# Patient Record
Sex: Female | Born: 1975
Health system: Southern US, Community
[De-identification: ages and names within clinical notes are randomized; demographics above are authoritative.]

## PROBLEM LIST (undated history)

## (undated) DIAGNOSIS — R002 Palpitations: Secondary | ICD-10-CM

## (undated) DIAGNOSIS — R87629 Unspecified abnormal cytological findings in specimens from vagina: Secondary | ICD-10-CM

## (undated) DIAGNOSIS — R87619 Unspecified abnormal cytological findings in specimens from cervix uteri: Secondary | ICD-10-CM

## (undated) DIAGNOSIS — D649 Anemia, unspecified: Secondary | ICD-10-CM

## (undated) DIAGNOSIS — IMO0002 Reserved for concepts with insufficient information to code with codable children: Secondary | ICD-10-CM

## (undated) DIAGNOSIS — O09529 Supervision of elderly multigravida, unspecified trimester: Secondary | ICD-10-CM

## (undated) HISTORY — DX: Anemia, unspecified: D64.9

## (undated) HISTORY — PX: OTHER SURGICAL HISTORY: SHX169

## (undated) HISTORY — DX: Unspecified abnormal cytological findings in specimens from cervix uteri: R87.619

## (undated) HISTORY — DX: Reserved for concepts with insufficient information to code with codable children: IMO0002

## (undated) HISTORY — PX: WISDOM TOOTH EXTRACTION: SHX21

## (undated) HISTORY — PX: LEEP: SHX91

## (undated) HISTORY — DX: Palpitations: R00.2

---

## 2005-10-19 ENCOUNTER — Emergency Department (HOSPITAL_COMMUNITY): Admission: EM | Admit: 2005-10-19 | Discharge: 2005-10-19 | Payer: Self-pay | Admitting: Emergency Medicine

## 2005-12-24 ENCOUNTER — Emergency Department (HOSPITAL_COMMUNITY): Admission: EM | Admit: 2005-12-24 | Discharge: 2005-12-24 | Payer: Self-pay | Admitting: Emergency Medicine

## 2006-04-07 ENCOUNTER — Emergency Department (HOSPITAL_COMMUNITY): Admission: EM | Admit: 2006-04-07 | Discharge: 2006-04-07 | Payer: Self-pay | Admitting: Family Medicine

## 2006-09-23 ENCOUNTER — Other Ambulatory Visit: Admission: RE | Admit: 2006-09-23 | Discharge: 2006-09-23 | Payer: Self-pay | Admitting: Gynecology

## 2006-12-20 ENCOUNTER — Emergency Department (HOSPITAL_COMMUNITY): Admission: EM | Admit: 2006-12-20 | Discharge: 2006-12-20 | Payer: Self-pay | Admitting: Emergency Medicine

## 2009-04-11 ENCOUNTER — Other Ambulatory Visit: Admission: RE | Admit: 2009-04-11 | Discharge: 2009-04-11 | Payer: Self-pay | Admitting: Gynecology

## 2009-04-11 ENCOUNTER — Ambulatory Visit: Payer: Self-pay | Admitting: Gynecology

## 2010-10-28 LAB — RPR: RPR: NONREACTIVE

## 2010-10-28 LAB — HEPATITIS B SURFACE ANTIGEN: Hepatitis B Surface Ag: NEGATIVE

## 2010-10-28 LAB — ABO/RH

## 2011-04-19 LAB — STREP B DNA PROBE: GBS: POSITIVE

## 2011-05-03 ENCOUNTER — Inpatient Hospital Stay (HOSPITAL_COMMUNITY): Admission: AD | Admit: 2011-05-03 | Payer: Self-pay | Source: Ambulatory Visit | Admitting: Obstetrics and Gynecology

## 2011-05-13 ENCOUNTER — Encounter (HOSPITAL_COMMUNITY): Payer: Self-pay | Admitting: *Deleted

## 2011-05-13 ENCOUNTER — Telehealth (HOSPITAL_COMMUNITY): Payer: Self-pay | Admitting: *Deleted

## 2011-05-13 NOTE — Telephone Encounter (Signed)
Preadmission screen  

## 2011-05-14 ENCOUNTER — Telehealth (HOSPITAL_COMMUNITY): Payer: Self-pay | Admitting: *Deleted

## 2011-05-14 ENCOUNTER — Encounter (HOSPITAL_COMMUNITY): Payer: Self-pay | Admitting: *Deleted

## 2011-05-14 NOTE — Telephone Encounter (Signed)
Preadmission screen  

## 2011-05-20 ENCOUNTER — Telehealth (HOSPITAL_COMMUNITY): Payer: Self-pay | Admitting: *Deleted

## 2011-05-20 NOTE — Telephone Encounter (Signed)
Preadmission screen  

## 2011-05-24 ENCOUNTER — Inpatient Hospital Stay (HOSPITAL_COMMUNITY): Admission: RE | Admit: 2011-05-24 | Payer: Self-pay | Source: Ambulatory Visit

## 2011-05-29 ENCOUNTER — Encounter (HOSPITAL_COMMUNITY): Payer: Self-pay | Admitting: Pharmacist

## 2011-05-30 ENCOUNTER — Inpatient Hospital Stay (HOSPITAL_COMMUNITY)
Admission: RE | Admit: 2011-05-30 | Discharge: 2011-06-03 | DRG: 765 | Disposition: A | Discharge status: Home / Self Care | Payer: 59 | Source: Ambulatory Visit | Attending: Obstetrics and Gynecology | Admitting: Obstetrics and Gynecology

## 2011-05-30 ENCOUNTER — Encounter (HOSPITAL_COMMUNITY): Payer: Self-pay

## 2011-05-30 DIAGNOSIS — Z22330 Carrier of Group B streptococcus: Secondary | ICD-10-CM

## 2011-05-30 DIAGNOSIS — O99892 Other specified diseases and conditions complicating childbirth: Secondary | ICD-10-CM | POA: Diagnosis present

## 2011-05-30 DIAGNOSIS — O09529 Supervision of elderly multigravida, unspecified trimester: Secondary | ICD-10-CM | POA: Diagnosis present

## 2011-05-30 DIAGNOSIS — O411090 Infection of amniotic sac and membranes, unspecified, unspecified trimester, not applicable or unspecified: Secondary | ICD-10-CM | POA: Diagnosis present

## 2011-05-30 DIAGNOSIS — Z3480 Encounter for supervision of other normal pregnancy, unspecified trimester: Secondary | ICD-10-CM | POA: Insufficient documentation

## 2011-05-30 LAB — CBC
HCT: 35 % — ABNORMAL LOW (ref 36.0–46.0)
MCV: 92.6 fL (ref 78.0–100.0)
Platelets: 201 10*3/uL (ref 150–400)
RBC: 3.78 MIL/uL — ABNORMAL LOW (ref 3.87–5.11)
WBC: 10.3 10*3/uL (ref 4.0–10.5)

## 2011-05-30 MED ORDER — DEXTROSE 5 % IV SOLN
2.5000 10*6.[IU] | INTRAVENOUS | Status: DC
  Administered 2011-05-31 (×6): 2.5 10*6.[IU] via INTRAVENOUS
  Filled 2011-05-30 (×5): qty 2.5

## 2011-05-30 MED ORDER — OXYTOCIN 20 UNITS IN LACTATED RINGERS INFUSION - SIMPLE: 125.0000 mL/h | Freq: Once | INTRAVENOUS | Status: DC

## 2011-05-30 MED ORDER — PENICILLIN G POTASSIUM 5000000 UNITS IJ SOLR
5.0000 10*6.[IU] | Freq: Once | INTRAVENOUS | Status: AC
  Administered 2011-05-30: 5 10*6.[IU] via INTRAVENOUS
  Filled 2011-05-30: qty 5

## 2011-05-30 MED ORDER — ONDANSETRON HCL 4 MG/2ML IJ SOLN: 4.0000 mg | Freq: Four times a day (QID) | INTRAMUSCULAR | Status: DC | PRN

## 2011-05-30 MED ORDER — LACTATED RINGERS IV SOLN
INTRAVENOUS | Status: DC
  Administered 2011-05-30 – 2011-05-31 (×3): via INTRAVENOUS
  Administered 2011-06-01: 125 mL/h via INTRAVENOUS

## 2011-05-30 MED ORDER — PENICILLIN G POTASSIUM 5000000 UNITS IJ SOLR: 2.5000 10*6.[IU] | Freq: Four times a day (QID) | INTRAVENOUS | Status: DC

## 2011-05-30 MED ORDER — OXYTOCIN BOLUS FROM INFUSION
500.0000 mL | Freq: Once | INTRAVENOUS | Status: DC
  Filled 2011-05-30: qty 500

## 2011-05-30 MED ORDER — LIDOCAINE HCL (PF) 1 % IJ SOLN
30.0000 mL | INTRAMUSCULAR | Status: DC | PRN
  Filled 2011-05-30: qty 30

## 2011-05-30 MED ORDER — MISOPROSTOL 25 MCG QUARTER TABLET
25.0000 ug | ORAL_TABLET | ORAL | Status: DC | PRN
  Administered 2011-05-30: 25 ug via VAGINAL
  Filled 2011-05-30 (×2): qty 0.25

## 2011-05-30 MED ORDER — ACETAMINOPHEN 325 MG PO TABS
650.0000 mg | ORAL_TABLET | ORAL | Status: DC | PRN
  Administered 2011-05-31: 650 mg via ORAL
  Filled 2011-05-30: qty 2

## 2011-05-30 MED ORDER — IBUPROFEN 600 MG PO TABS: 600.0000 mg | ORAL_TABLET | Freq: Four times a day (QID) | ORAL | Status: DC | PRN

## 2011-05-30 MED ORDER — FLEET ENEMA 7-19 GM/118ML RE ENEM
1.0000 | ENEMA | Freq: Once | RECTAL | Status: AC
  Administered 2011-05-30: 1 via RECTAL

## 2011-05-30 MED ORDER — CITRIC ACID-SODIUM CITRATE 334-500 MG/5ML PO SOLN
30.0000 mL | ORAL | Status: DC | PRN
  Administered 2011-05-30 – 2011-06-01 (×2): 30 mL via ORAL
  Filled 2011-05-30 (×2): qty 15

## 2011-05-30 MED ORDER — LACTATED RINGERS IV SOLN
500.0000 mL | INTRAVENOUS | Status: DC | PRN
  Administered 2011-05-31 (×3): 500 mL via INTRAVENOUS

## 2011-05-30 MED ORDER — TERBUTALINE SULFATE 1 MG/ML IJ SOLN: 0.2500 mg | Freq: Once | INTRAMUSCULAR | Status: AC | PRN

## 2011-05-30 MED ORDER — OXYCODONE-ACETAMINOPHEN 5-325 MG PO TABS: 1.0000 | ORAL_TABLET | ORAL | Status: DC | PRN

## 2011-05-30 MED ORDER — PENICILLIN G POTASSIUM 5000000 UNITS IJ SOLR: 5.0000 10*6.[IU] | Freq: Once | INTRAVENOUS | Status: DC

## 2011-05-30 NOTE — H&P (Signed)
Laura Carroll is a 36 y.o. female presenting for induction of labor.  Prenatal care essentially uncomplicated, see prenatal records for complete history.  Maternal Medical History:  Prenatal complications: no prenatal complications   OB History    Grav Para Term Preterm Abortions TAB SAB Ect Mult Living   3 2 2       2     SVD at term x 2 without complications  Past Medical History  Diagnosis Date  . Abnormal Pap smear    Past Surgical History  Procedure Date  . Wisdom tooth extraction   . Leep   . Dental implant    Family History: family history includes Alcohol abuse in her mother; Anxiety disorder in her mother; COPD in her maternal grandmother; Cancer in her maternal aunt and maternal grandfather; and Mental illness in her mother. Social History:  reports that she has never smoked. She has never used smokeless tobacco. She reports that she does not drink alcohol or use illicit drugs.  Review of Systems  Respiratory: Negative.   Cardiovascular: Negative.       Last menstrual period 08/14/2010. Maternal Exam:  Uterine Assessment: Contraction frequency is rare.   Abdomen: Patient reports no abdominal tenderness. Estimated fetal weight is 8 lbs.   Fetal presentation: vertex  Introitus: Normal vulva. Normal vagina.    Physical Exam  Constitutional: She appears well-developed and well-nourished.  Cardiovascular: Normal rate, regular rhythm and normal heart sounds.   No murmur heard. Respiratory: Effort normal and breath sounds normal. No respiratory distress. She has no wheezes.  GI: Soft.       Gravid     Prenatal labs: ABO, Rh: O/Positive/-- (09/26 0000) Antibody: Negative (09/26 0000) Rubella: Immune (09/26 0000) RPR: Nonreactive (09/26 0000)  HBsAg: Negative (09/26 0000)  HIV: Non-reactive (09/26 0000)  GBS: Positive (03/18 0000)   Assessment/Plan: IUP at 41+ weeks with unfavorable cervix, being admitted for ripening and induction.  Will start Cytotec  and evaluate in am for further plan.  Will start PCN for GBS prophylaxis.     Drury Ardizzone D 05/30/2011, 6:04 PM

## 2011-05-31 ENCOUNTER — Encounter (HOSPITAL_COMMUNITY): Payer: Self-pay

## 2011-05-31 ENCOUNTER — Encounter (HOSPITAL_COMMUNITY): Payer: Self-pay | Admitting: Anesthesiology

## 2011-05-31 MED ORDER — FENTANYL 2.5 MCG/ML BUPIVACAINE 1/10 % EPIDURAL INFUSION (WH - ANES)
INTRAMUSCULAR | Status: DC | PRN
  Administered 2011-05-31: 14 mL/h via EPIDURAL

## 2011-05-31 MED ORDER — FENTANYL 2.5 MCG/ML BUPIVACAINE 1/10 % EPIDURAL INFUSION (WH - ANES)
14.0000 mL/h | INTRAMUSCULAR | Status: DC
  Administered 2011-05-31 – 2011-06-01 (×2): 14 mL/h via EPIDURAL
  Filled 2011-05-31 (×3): qty 60

## 2011-05-31 MED ORDER — PHENYLEPHRINE 40 MCG/ML (10ML) SYRINGE FOR IV PUSH (FOR BLOOD PRESSURE SUPPORT): 80.0000 ug | PREFILLED_SYRINGE | INTRAVENOUS | Status: DC | PRN

## 2011-05-31 MED ORDER — TERBUTALINE SULFATE 1 MG/ML IJ SOLN: 0.2500 mg | Freq: Once | INTRAMUSCULAR | Status: AC | PRN

## 2011-05-31 MED ORDER — OXYTOCIN 20 UNITS IN LACTATED RINGERS INFUSION - SIMPLE
1.0000 m[IU]/min | INTRAVENOUS | Status: DC
  Administered 2011-05-31: 4 m[IU]/min via INTRAVENOUS
  Administered 2011-05-31: 1 m[IU]/min via INTRAVENOUS
  Filled 2011-05-31: qty 1000

## 2011-05-31 MED ORDER — ZOLPIDEM TARTRATE 10 MG PO TABS
10.0000 mg | ORAL_TABLET | Freq: Every evening | ORAL | Status: DC | PRN
  Administered 2011-05-31: 10 mg via ORAL
  Filled 2011-05-31: qty 1

## 2011-05-31 MED ORDER — EPHEDRINE 5 MG/ML INJ
10.0000 mg | INTRAVENOUS | Status: DC | PRN
  Filled 2011-05-31: qty 4

## 2011-05-31 MED ORDER — PHENYLEPHRINE 40 MCG/ML (10ML) SYRINGE FOR IV PUSH (FOR BLOOD PRESSURE SUPPORT)
80.0000 ug | PREFILLED_SYRINGE | INTRAVENOUS | Status: DC | PRN
  Filled 2011-05-31: qty 5

## 2011-05-31 MED ORDER — SODIUM CHLORIDE 0.9 % IV SOLN
3.0000 g | Freq: Four times a day (QID) | INTRAVENOUS | Status: DC
  Administered 2011-05-31: 3 g via INTRAVENOUS
  Filled 2011-05-31 (×4): qty 3

## 2011-05-31 MED ORDER — DIPHENHYDRAMINE HCL 50 MG/ML IJ SOLN: 12.5000 mg | INTRAMUSCULAR | Status: DC | PRN

## 2011-05-31 MED ORDER — EPHEDRINE 5 MG/ML INJ: 10.0000 mg | INTRAVENOUS | Status: DC | PRN

## 2011-05-31 MED ORDER — LACTATED RINGERS IV SOLN
500.0000 mL | Freq: Once | INTRAVENOUS | Status: AC
  Administered 2011-05-31: 17:00:00 via INTRAVENOUS

## 2011-05-31 MED ORDER — LIDOCAINE HCL (PF) 1 % IJ SOLN
INTRAMUSCULAR | Status: DC | PRN
  Administered 2011-05-31 (×2): 4 mL

## 2011-05-31 NOTE — Anesthesia Preprocedure Evaluation (Signed)
Anesthesia Evaluation  Patient identified by MRN, date of birth, ID band Patient awake    Reviewed: Allergy & Precautions, H&P , Patient's Chart, lab work & pertinent test results  Airway Mallampati: II TM Distance: >3 FB Neck ROM: full    Dental No notable dental hx. (+) Teeth Intact   Pulmonary neg pulmonary ROS,  breath sounds clear to auscultation  Pulmonary exam normal       Cardiovascular negative cardio ROS  Rhythm:regular Rate:Normal     Neuro/Psych negative neurological ROS  negative psych ROS   GI/Hepatic Neg liver ROS, GERD-  Medicated and Controlled,  Endo/Other  negative endocrine ROS  Renal/GU negative Renal ROS  negative genitourinary   Musculoskeletal   Abdominal   Peds  Hematology negative hematology ROS (+)   Anesthesia Other Findings   Reproductive/Obstetrics (+) Pregnancy                           Anesthesia Physical Anesthesia Plan  ASA: II  Anesthesia Plan: Epidural   Post-op Pain Management:    Induction:   Airway Management Planned:   Additional Equipment:   Intra-op Plan:   Post-operative Plan:   Informed Consent: I have reviewed the patients History and Physical, chart, labs and discussed the procedure including the risks, benefits and alternatives for the proposed anesthesia with the patient or authorized representative who has indicated his/her understanding and acceptance.     Plan Discussed with: Anesthesiologist, Surgeon and CRNA  Anesthesia Plan Comments:         Anesthesia Quick Evaluation

## 2011-05-31 NOTE — Progress Notes (Signed)
Pt able to sleep through UC's and contracting greater than 3 in ten min window.  Dr Ambrose Mantle on unit and notified.  No orders.

## 2011-05-31 NOTE — Progress Notes (Signed)
Comfortable, feeling some pressure Temp 101, VSS FHT- Cat II, some variables, mod variability, + scalp stim, ctx q 3 minutes VE- 8/80/0, vtx Starting Unasyn for probable chorioamnionitis, d/c PCN.  Continue pitocin, monitor progress.

## 2011-05-31 NOTE — Progress Notes (Signed)
Feeling ctx, only got one dose of cytotec due to ctx Afeb, VSS FHT- Cat I, irreg ctx VE- 1/30/-3, vtx Will place foley in cervix and start pitocin

## 2011-05-31 NOTE — Progress Notes (Signed)
Dr. Jackelyn Knife on the phone and notified of FHR change, SVE, RN interventions, maternal temperature and pitocin level. MD stated he will review the strip from home. Will continue to monitor.

## 2011-05-31 NOTE — Progress Notes (Signed)
Dr. Jackelyn Knife at the bedside and notified of pt status and SVE. Provider reviewed strip. Will continue to monitor.

## 2011-05-31 NOTE — Progress Notes (Signed)
Feeling ctx Afeb, VSS FHT- Cat I, irreg ctx VE- foley removed, was just outside cervix, 3/50/-3, vtx Continue pitocin, monitor progress

## 2011-05-31 NOTE — Progress Notes (Signed)
Comfortable with epidural Afeb, VSS FHT- Cat I, ctx q 3 min VE- 5/50/-2, vtx, AROM clear Comtinue pitocin, monitor progress

## 2011-05-31 NOTE — Progress Notes (Signed)
Dr. Jackelyn Knife on the phone and reviewed the strip. Orders received to start antibiotic. Will continue to monitor.

## 2011-05-31 NOTE — Anesthesia Procedure Notes (Signed)
Epidural Patient location during procedure: OB Start time: 05/31/2011 5:39 PM  Staffing Anesthesiologist: Vaneza Pickart A.  Preanesthetic Checklist Completed: patient identified, site marked, surgical consent, pre-op evaluation, timeout performed, IV checked, risks and benefits discussed and monitors and equipment checked  Epidural Patient position: sitting Prep: site prepped and draped and DuraPrep Patient monitoring: continuous pulse ox and blood pressure Approach: midline Injection technique: LOR air  Needle:  Needle type: Tuohy  Needle gauge: 17 G Needle length: 9 cm Needle insertion depth: 5 cm cm Catheter type: closed end flexible Catheter size: 19 Gauge Catheter at skin depth: 10 cm Test dose: negative and Other  Assessment Events: blood not aspirated, injection not painful, no injection resistance, negative IV test and no paresthesia  Additional Notes Patient identified. Risks and benefits discussed including failed block, incomplete  Pain control, post dural puncture headache, nerve damage, paralysis, blood pressure Changes, nausea, vomiting, reactions to medications-both toxic and allergic and post Partum back pain. All questions were answered. Patient expressed understanding and wished to proceed. Sterile technique was used throughout procedure. Epidural site was Dressed with sterile barrier dressing. No paresthesias, signs of intravascular injection Or signs of intrathecal spread were encountered.  Patient was more comfortable after the epidural was dosed. Please see RN's note for documentation of vital signs and FHR which are stable.

## 2011-05-31 NOTE — Progress Notes (Signed)
Vtx by palpation prior to starting pitocin

## 2011-05-31 NOTE — Progress Notes (Signed)
Dr. Jackelyn Knife on the phone and notified of pt status, FHR, and SVE. Will continue to monitor.

## 2011-05-31 NOTE — Progress Notes (Signed)
Foley placed in cervix, 60 cc saline in balloon

## 2011-06-01 ENCOUNTER — Encounter (HOSPITAL_COMMUNITY): Payer: Self-pay | Admitting: Anesthesiology

## 2011-06-01 ENCOUNTER — Encounter (HOSPITAL_COMMUNITY): Payer: Self-pay

## 2011-06-01 ENCOUNTER — Encounter (HOSPITAL_COMMUNITY)
Admission: RE | Disposition: A | Discharge status: Home / Self Care | Payer: Self-pay | Source: Ambulatory Visit | Attending: Obstetrics and Gynecology

## 2011-06-01 ENCOUNTER — Inpatient Hospital Stay (HOSPITAL_COMMUNITY): Payer: 59 | Admitting: Anesthesiology

## 2011-06-01 SURGERY — Surgical Case
Anesthesia: Regional

## 2011-06-01 MED ORDER — NALOXONE HCL 0.4 MG/ML IJ SOLN: 0.4000 mg | INTRAMUSCULAR | Status: DC | PRN

## 2011-06-01 MED ORDER — KETOROLAC TROMETHAMINE 30 MG/ML IJ SOLN
30.0000 mg | Freq: Four times a day (QID) | INTRAMUSCULAR | Status: AC | PRN
  Administered 2011-06-01: 30 mg via INTRAVENOUS

## 2011-06-01 MED ORDER — MORPHINE SULFATE 0.5 MG/ML IJ SOLN
INTRAMUSCULAR | Status: AC
  Filled 2011-06-01: qty 10

## 2011-06-01 MED ORDER — EPHEDRINE SULFATE 50 MG/ML IJ SOLN
INTRAMUSCULAR | Status: DC | PRN
  Administered 2011-06-01: 15 mg via INTRAVENOUS

## 2011-06-01 MED ORDER — SODIUM CHLORIDE 0.9 % IV SOLN
3.0000 g | Freq: Four times a day (QID) | INTRAVENOUS | Status: DC
  Filled 2011-06-01 (×8): qty 3

## 2011-06-01 MED ORDER — SODIUM CHLORIDE 0.9 % IV SOLN
3.0000 g | Freq: Once | INTRAVENOUS | Status: AC
  Administered 2011-06-01: 3 g via INTRAVENOUS
  Filled 2011-06-01: qty 3

## 2011-06-01 MED ORDER — SODIUM CHLORIDE 0.9 % IV SOLN
1.0000 ug/kg/h | INTRAVENOUS | Status: DC | PRN
  Filled 2011-06-01: qty 2.5

## 2011-06-01 MED ORDER — NALBUPHINE SYRINGE 5 MG/0.5 ML
5.0000 mg | INJECTION | INTRAMUSCULAR | Status: DC | PRN
  Filled 2011-06-01: qty 1

## 2011-06-01 MED ORDER — SODIUM CHLORIDE 0.9 % IJ SOLN: 3.0000 mL | INTRAMUSCULAR | Status: DC | PRN

## 2011-06-01 MED ORDER — OXYTOCIN 10 UNIT/ML IJ SOLN
INTRAMUSCULAR | Status: AC
  Filled 2011-06-01: qty 4

## 2011-06-01 MED ORDER — DIPHENHYDRAMINE HCL 25 MG PO CAPS: 25.0000 mg | ORAL_CAPSULE | ORAL | Status: DC | PRN

## 2011-06-01 MED ORDER — PRENATAL MULTIVITAMIN CH
1.0000 | ORAL_TABLET | Freq: Every day | ORAL | Status: DC
  Administered 2011-06-02 – 2011-06-03 (×2): 1 via ORAL
  Filled 2011-06-01 (×2): qty 1

## 2011-06-01 MED ORDER — SODIUM BICARBONATE 8.4 % IV SOLN
INTRAVENOUS | Status: AC
  Filled 2011-06-01: qty 50

## 2011-06-01 MED ORDER — LANOLIN HYDROUS EX OINT: 1.0000 "application " | TOPICAL_OINTMENT | CUTANEOUS | Status: DC | PRN

## 2011-06-01 MED ORDER — FENTANYL CITRATE 0.05 MG/ML IJ SOLN: 25.0000 ug | INTRAMUSCULAR | Status: DC | PRN

## 2011-06-01 MED ORDER — LACTATED RINGERS IV SOLN
INTRAVENOUS | Status: DC | PRN
  Administered 2011-06-01 (×3): via INTRAVENOUS

## 2011-06-01 MED ORDER — SIMETHICONE 80 MG PO CHEW: 80.0000 mg | CHEWABLE_TABLET | ORAL | Status: DC | PRN

## 2011-06-01 MED ORDER — PROMETHAZINE HCL 25 MG/ML IJ SOLN: 6.2500 mg | INTRAMUSCULAR | Status: DC | PRN

## 2011-06-01 MED ORDER — ONDANSETRON HCL 4 MG/2ML IJ SOLN
INTRAMUSCULAR | Status: DC | PRN
  Administered 2011-06-01: 4 mg via INTRAVENOUS

## 2011-06-01 MED ORDER — LIDOCAINE-EPINEPHRINE (PF) 2 %-1:200000 IJ SOLN
INTRAMUSCULAR | Status: AC
  Filled 2011-06-01: qty 20

## 2011-06-01 MED ORDER — MEPERIDINE HCL 25 MG/ML IJ SOLN: 6.2500 mg | INTRAMUSCULAR | Status: DC | PRN

## 2011-06-01 MED ORDER — TETANUS-DIPHTH-ACELL PERTUSSIS 5-2.5-18.5 LF-MCG/0.5 IM SUSP: 0.5000 mL | Freq: Once | INTRAMUSCULAR | Status: DC

## 2011-06-01 MED ORDER — PHENYLEPHRINE HCL 10 MG/ML IJ SOLN
INTRAMUSCULAR | Status: DC | PRN
  Administered 2011-06-01: 80 ug via INTRAVENOUS

## 2011-06-01 MED ORDER — DIPHENHYDRAMINE HCL 25 MG PO CAPS: 25.0000 mg | ORAL_CAPSULE | Freq: Four times a day (QID) | ORAL | Status: DC | PRN

## 2011-06-01 MED ORDER — KETOROLAC TROMETHAMINE 30 MG/ML IJ SOLN: 30.0000 mg | Freq: Four times a day (QID) | INTRAMUSCULAR | Status: AC | PRN

## 2011-06-01 MED ORDER — PHENYLEPHRINE 40 MCG/ML (10ML) SYRINGE FOR IV PUSH (FOR BLOOD PRESSURE SUPPORT)
PREFILLED_SYRINGE | INTRAVENOUS | Status: AC
  Filled 2011-06-01: qty 5

## 2011-06-01 MED ORDER — MENTHOL 3 MG MT LOZG: 1.0000 | LOZENGE | OROMUCOSAL | Status: DC | PRN

## 2011-06-01 MED ORDER — ONDANSETRON HCL 4 MG PO TABS: 4.0000 mg | ORAL_TABLET | ORAL | Status: DC | PRN

## 2011-06-01 MED ORDER — ACETAMINOPHEN 325 MG PO TABS: 325.0000 mg | ORAL_TABLET | ORAL | Status: DC | PRN

## 2011-06-01 MED ORDER — SODIUM CHLORIDE 0.9 % IV SOLN
3.0000 g | Freq: Four times a day (QID) | INTRAVENOUS | Status: DC
  Administered 2011-06-01 – 2011-06-02 (×2): 3 g via INTRAVENOUS
  Filled 2011-06-01 (×5): qty 3

## 2011-06-01 MED ORDER — EPHEDRINE 5 MG/ML INJ
INTRAVENOUS | Status: AC
  Filled 2011-06-01: qty 10

## 2011-06-01 MED ORDER — MORPHINE SULFATE (PF) 0.5 MG/ML IJ SOLN
INTRAMUSCULAR | Status: DC | PRN
  Administered 2011-06-01: 3 mg via EPIDURAL

## 2011-06-01 MED ORDER — WITCH HAZEL-GLYCERIN EX PADS: 1.0000 "application " | MEDICATED_PAD | CUTANEOUS | Status: DC | PRN

## 2011-06-01 MED ORDER — OXYTOCIN 20 UNITS IN LACTATED RINGERS INFUSION - SIMPLE
125.0000 mL/h | INTRAVENOUS | Status: AC
  Administered 2011-06-01: 125 mL/h via INTRAVENOUS
  Filled 2011-06-01: qty 1000

## 2011-06-01 MED ORDER — MORPHINE SULFATE (PF) 0.5 MG/ML IJ SOLN
INTRAMUSCULAR | Status: DC | PRN
  Administered 2011-06-01: 2 mg via EPIDURAL

## 2011-06-01 MED ORDER — DIPHENHYDRAMINE HCL 50 MG/ML IJ SOLN: 25.0000 mg | INTRAMUSCULAR | Status: DC | PRN

## 2011-06-01 MED ORDER — MAGNESIUM HYDROXIDE 400 MG/5ML PO SUSP: 30.0000 mL | ORAL | Status: DC | PRN

## 2011-06-01 MED ORDER — SENNOSIDES-DOCUSATE SODIUM 8.6-50 MG PO TABS
2.0000 | ORAL_TABLET | Freq: Every day | ORAL | Status: DC
  Administered 2011-06-02: 2 via ORAL

## 2011-06-01 MED ORDER — OXYCODONE-ACETAMINOPHEN 5-325 MG PO TABS
1.0000 | ORAL_TABLET | ORAL | Status: DC | PRN
  Administered 2011-06-01 – 2011-06-03 (×5): 1 via ORAL
  Filled 2011-06-01 (×5): qty 1

## 2011-06-01 MED ORDER — ONDANSETRON HCL 4 MG/2ML IJ SOLN: 4.0000 mg | INTRAMUSCULAR | Status: DC | PRN

## 2011-06-01 MED ORDER — SCOPOLAMINE 1 MG/3DAYS TD PT72
MEDICATED_PATCH | TRANSDERMAL | Status: AC
  Filled 2011-06-01: qty 1

## 2011-06-01 MED ORDER — ONDANSETRON HCL 4 MG/2ML IJ SOLN
INTRAMUSCULAR | Status: AC
  Filled 2011-06-01: qty 2

## 2011-06-01 MED ORDER — MIDAZOLAM HCL 2 MG/2ML IJ SOLN: 0.5000 mg | Freq: Once | INTRAMUSCULAR | Status: DC | PRN

## 2011-06-01 MED ORDER — ONDANSETRON HCL 4 MG/2ML IJ SOLN: 4.0000 mg | Freq: Three times a day (TID) | INTRAMUSCULAR | Status: DC | PRN

## 2011-06-01 MED ORDER — ZOLPIDEM TARTRATE 5 MG PO TABS: 5.0000 mg | ORAL_TABLET | Freq: Every evening | ORAL | Status: DC | PRN

## 2011-06-01 MED ORDER — ACETAMINOPHEN 10 MG/ML IV SOLN
1000.0000 mg | Freq: Four times a day (QID) | INTRAVENOUS | Status: AC | PRN
  Filled 2011-06-01: qty 100

## 2011-06-01 MED ORDER — IBUPROFEN 600 MG PO TABS
600.0000 mg | ORAL_TABLET | Freq: Four times a day (QID) | ORAL | Status: DC
  Administered 2011-06-01 – 2011-06-03 (×9): 600 mg via ORAL
  Filled 2011-06-01 (×9): qty 1

## 2011-06-01 MED ORDER — DIBUCAINE 1 % RE OINT: 1.0000 "application " | TOPICAL_OINTMENT | RECTAL | Status: DC | PRN

## 2011-06-01 MED ORDER — SODIUM BICARBONATE 8.4 % IV SOLN
INTRAVENOUS | Status: DC | PRN
  Administered 2011-06-01: 10 mL via EPIDURAL

## 2011-06-01 MED ORDER — METOCLOPRAMIDE HCL 5 MG/ML IJ SOLN: 10.0000 mg | Freq: Three times a day (TID) | INTRAMUSCULAR | Status: DC | PRN

## 2011-06-01 MED ORDER — SCOPOLAMINE 1 MG/3DAYS TD PT72
1.0000 | MEDICATED_PATCH | Freq: Once | TRANSDERMAL | Status: DC
  Administered 2011-06-01: 1.5 mg via TRANSDERMAL

## 2011-06-01 MED ORDER — DIPHENHYDRAMINE HCL 50 MG/ML IJ SOLN: 12.5000 mg | INTRAMUSCULAR | Status: DC | PRN

## 2011-06-01 MED ORDER — OXYTOCIN 20 UNITS IN LACTATED RINGERS INFUSION - SIMPLE
INTRAVENOUS | Status: DC | PRN
  Administered 2011-06-01: 20 [IU] via INTRAVENOUS

## 2011-06-01 MED ORDER — SIMETHICONE 80 MG PO CHEW
80.0000 mg | CHEWABLE_TABLET | Freq: Three times a day (TID) | ORAL | Status: DC
  Administered 2011-06-01 – 2011-06-03 (×8): 80 mg via ORAL

## 2011-06-01 MED ORDER — KETOROLAC TROMETHAMINE 30 MG/ML IJ SOLN
INTRAMUSCULAR | Status: AC
  Filled 2011-06-01: qty 1

## 2011-06-01 MED ORDER — MEASLES, MUMPS & RUBELLA VAC ~~LOC~~ INJ
0.5000 mL | INJECTION | Freq: Once | SUBCUTANEOUS | Status: DC
  Filled 2011-06-01: qty 0.5

## 2011-06-01 SURGICAL SUPPLY — 26 items
CHLORAPREP W/TINT 26ML (MISCELLANEOUS) ×2 IMPLANT
CLOTH BEACON ORANGE TIMEOUT ST (SAFETY) ×2 IMPLANT
CONTAINER PREFILL 10% NBF 15ML (MISCELLANEOUS) IMPLANT
ELECT REM PT RETURN 9FT ADLT (ELECTROSURGICAL) ×2
ELECTRODE REM PT RTRN 9FT ADLT (ELECTROSURGICAL) ×1 IMPLANT
EXTRACTOR VACUUM KIWI (MISCELLANEOUS) IMPLANT
EXTRACTOR VACUUM M CUP 4 TUBE (SUCTIONS) IMPLANT
GLOVE BIO SURGEON STRL SZ8 (GLOVE) ×2 IMPLANT
GLOVE ORTHO TXT STRL SZ7.5 (GLOVE) ×2 IMPLANT
GOWN PREVENTION PLUS LG XLONG (DISPOSABLE) ×4 IMPLANT
KIT ABG SYR 3ML LUER SLIP (SYRINGE) IMPLANT
NEEDLE HYPO 25X5/8 SAFETYGLIDE (NEEDLE) ×2 IMPLANT
NS IRRIG 1000ML POUR BTL (IV SOLUTION) ×2 IMPLANT
PACK C SECTION WH (CUSTOM PROCEDURE TRAY) ×2 IMPLANT
RTRCTR C-SECT PINK 25CM LRG (MISCELLANEOUS) ×2 IMPLANT
SLEEVE SCD COMPRESS KNEE MED (MISCELLANEOUS) IMPLANT
STAPLER VISISTAT 35W (STAPLE) IMPLANT
SUT CHROMIC 1 CTX 36 (SUTURE) ×4 IMPLANT
SUT PLAIN 0 NONE (SUTURE) IMPLANT
SUT PLAIN 2 0 XLH (SUTURE) IMPLANT
SUT VIC AB 0 CT1 27 (SUTURE) ×2
SUT VIC AB 0 CT1 27XBRD ANBCTR (SUTURE) ×2 IMPLANT
SUT VIC AB 4-0 KS 27 (SUTURE) IMPLANT
TOWEL OR 17X24 6PK STRL BLUE (TOWEL DISPOSABLE) ×4 IMPLANT
TRAY FOLEY CATH 14FR (SET/KITS/TRAYS/PACK) ×2 IMPLANT
WATER STERILE IRR 1000ML POUR (IV SOLUTION) ×2 IMPLANT

## 2011-06-01 NOTE — Progress Notes (Signed)
Cervix unchanged FHT improved Will proceed with c-section for arrest of dilation

## 2011-06-01 NOTE — Progress Notes (Signed)
Comfortable Afeb now, VSS FHT- Cat II, some prolonged decels, mod variability, + scalp stim, ctx q 3 min VE- unchanged Discussed protracted labor with pt and husband, given option of watching for one more hour as long as FHT ok, or proceeding with c-section now.  They want to wait one more hour.  Discussed c-section procedure and risks.  Will recheck at 0230, c-section if no change.  Continue Unasyn for chorio.

## 2011-06-01 NOTE — Progress Notes (Signed)
POD #0 LTCS Doing ok so far Afeb, VSS Abd- soft, sl distended, dressing C/D/I Continue routine care, continue Unasyn for chorio.  Discussed circumcision procedure and risks.

## 2011-06-01 NOTE — Progress Notes (Signed)
Dr. Jackelyn Knife notified of pt status, FHR, decelerations, RN interventions, UC pattern, pitocin level and SVE. MD states he will come and reassess in an hour. Will continue to monitor.

## 2011-06-01 NOTE — Progress Notes (Signed)
Dr. Jackelyn Knife at the bedside and discussed the risks and benefits of a caesarean section delivery. Pt stated understanding and informed consents signed. Will continue to monitor.

## 2011-06-01 NOTE — Progress Notes (Signed)
Dr. Jackelyn Knife at the bedside. Decision made for caesarean section delivery due to arrest of dilatation.

## 2011-06-01 NOTE — Anesthesia Postprocedure Evaluation (Signed)
Anesthesia Post Note  Patient: Laura Carroll  Procedure(s) Performed: Procedure(s) (LRB): CESAREAN SECTION (N/A)  Anesthesia type: Epidural  Patient location: PACU  Post pain: Pain level controlled  Post assessment: Post-op Vital signs reviewed  Last Vitals:  Filed Vitals:   06/01/11 0500  BP:   Pulse: 107  Temp:   Resp: 24    Post vital signs: Reviewed  Level of consciousness: awake  Complications: No apparent anesthesia complications

## 2011-06-01 NOTE — Op Note (Signed)
Preoperative diagnosis: Intrauterine pregnancy at 41 weeks, arrest of dilation Postoperative diagnosis: Same Procedure: Primary low transverse cesarean section without extensions Surgeon: Lavina Hamman M.D. Anesthesia: Epidural Findings: Patient had normal gravid anatomy and delivered a viable female infant with Apgars of 9 and 9 weight pending Estimated blood loss: 800 cc Specimens: Placenta sent to labor and delivery Complications: None  Procedure in detail: The patient was taken to the operating room and placed in the dorsosupine position. Her previously placed epidural was dosed appropriately. Abdomen was then prepped and draped in the usual sterile fashion. A Foley catheter had previously been placed. The level of her anesthesia was found to be adequate. Abdomen was entered via a standard Pfannenstiel incision. Once the peritoneal cavity was entered the Alexis disposable self-retaining retractor was placed and good visualization was achieved. A 4 cm transverse incision was then made in the lower uterine segment pushing the bladder inferior. Once the uterine cavity was entered the incision was extended digitally. The fetal vertex was grasped and delivered through the incision atraumatically. Mouth and nares were suctioned. The remainder of the infant then delivered atraumatically. Cord was doubly clamped and cut and the infant handed to the awaiting pediatric team. Cord blood was obtained. The placenta delivered spontaneously. Uterus was wiped dry with clean lap pad and all clots and debris were removed. Uterine incision was inspected and found to be free of extensions. Uterine incision was closed in 2 layers with the first layer being a running locking layer with #1 chromic and the second layer being an imbricating layer also with #1 chromic. Bleeding from the right angle was controlled with 2 figure 8 sutures of #1 Chromic.  Tubes and ovaries were inspected and found to be normal. Uterine incision  was inspected and found to be hemostatic. Bleeding from serosal edges was controlled with electrocautery. The Alexis retractor was removed. Subfascial space was irrigated and made hemostatic with electrocautery. Fascia was closed in running fashion starting at both ends and meeting in the middle with 0 Vicryl. Subcutaneous tissue was then irrigated and made hemostatic with electrocautery and then closed with running 2-0 plain gut.   Skin was closed with staples followed by a sterile dressing. Patient tolerated the procedure well and was taken to the recovery in stable condition. Counts were correct x2 and she had PAS hose on throughout the procedure.

## 2011-06-01 NOTE — Transfer of Care (Signed)
Immediate Anesthesia Transfer of Care Note  Patient: Laura Carroll  Procedure(s) Performed: Procedure(s) (LRB): CESAREAN SECTION (N/A)  Patient Location: PACU  Anesthesia Type: Epidural  Level of Consciousness: awake  Airway & Oxygen Therapy: Patient Spontanous Breathing  Post-op Assessment: Report given to PACU RN and Post -op Vital signs reviewed and stable  Post vital signs: stable  Complications: No apparent anesthesia complications

## 2011-06-02 ENCOUNTER — Encounter (HOSPITAL_COMMUNITY): Payer: Self-pay | Admitting: Obstetrics and Gynecology

## 2011-06-02 LAB — CBC
MCH: 30.7 pg (ref 26.0–34.0)
MCHC: 32.5 g/dL (ref 30.0–36.0)
MCV: 94.5 fL (ref 78.0–100.0)
Platelets: 171 10*3/uL (ref 150–400)
RBC: 2.9 MIL/uL — ABNORMAL LOW (ref 3.87–5.11)
RDW: 14.8 % (ref 11.5–15.5)

## 2011-06-02 NOTE — Progress Notes (Signed)
Subjective: Postpartum Day #1: Cesarean Delivery Patient reports incisional pain and tolerating PO.    Objective: Vital signs in last 24 hours: Temp:  [98.3 F (36.8 C)-98.5 F (36.9 C)] 98.3 F (36.8 C) (05/01 0533) Pulse Rate:  [75-92] 75  (05/01 0533) Resp:  [18] 18  (05/01 0533) BP: (93-115)/(53-71) 93/57 mmHg (05/01 0533) SpO2:  [95 %-99 %] 98 % (04/30 2300)  Physical Exam:  General: alert Lochia: appropriate Uterine Fundus: firm Incision: dressing C/D/I   Basename 06/02/11 0525 05/30/11 2047  HGB 8.9* 11.9*  HCT 27.4* 35.0*    Assessment/Plan: Status post Cesarean section. Doing well postoperatively.  Continue current care, will recheck Hgb tomorrow am to make sure it is stable.  Since afebrile will d/c Unasyn.  Merlina Marchena D 06/02/2011, 8:45 AM

## 2011-06-03 LAB — CBC
HCT: 24.8 % — ABNORMAL LOW (ref 36.0–46.0)
MCHC: 32.3 g/dL (ref 30.0–36.0)
MCV: 95 fL (ref 78.0–100.0)
RDW: 14.7 % (ref 11.5–15.5)
WBC: 9.2 10*3/uL (ref 4.0–10.5)

## 2011-06-03 MED ORDER — OXYCODONE-ACETAMINOPHEN 5-325 MG PO TABS: 1.0000 | ORAL_TABLET | ORAL | Status: AC | PRN

## 2011-06-03 NOTE — Progress Notes (Signed)
POD #2 LTCS Doing ok, sore Afeb, VSS Abd- soft, fundus firm, incision intact Hgb 8.9 to 8.0 Will continue current care

## 2011-06-03 NOTE — Discharge Instructions (Signed)
As per discharge pamphlet °

## 2011-06-03 NOTE — Discharge Summary (Addendum)
Obstetric Discharge Summary Reason for Admission: induction of labor Prenatal Procedures: NST Intrapartum Procedures: cesarean: low cervical, transverse Postpartum Procedures: none Complications-Operative and Postpartum: none Hemoglobin  Date Value Range Status  06/03/2011 8.0* 12.0-15.0 (g/dL) Final     HCT  Date Value Range Status  06/03/2011 24.8* 36.0-46.0 (%) Final    Physical Exam:  General: alert Lochia: appropriate Uterine Fundus: firm Incision: healing well  Discharge Diagnoses: Term Pregnancy-delivered and arrest of dilation  Discharge Information: Date: 06/03/2011 Activity: pelvic rest and no strenuous activity Diet: routine Medications: Ibuprofen and Percocet Condition: stable Instructions: refer to practice specific booklet Discharge to: home   Newborn Data: Live born female  Birth Weight: 10 lb 2.3 oz (4600 g) APGAR: 9, 9  Home with mother.  Tauno Falotico D 06/03/2011, 12:18 PM

## 2013-12-03 ENCOUNTER — Encounter (HOSPITAL_COMMUNITY): Payer: Self-pay | Admitting: Obstetrics and Gynecology

## 2015-02-02 NOTE — L&D Delivery Note (Addendum)
Pt about 8cm dilated with fetal left arm noted protruding out of vagina. Fetal head not palpated. Loop of umbilical cord noted around arm. With next contraction and maternal push fetus delivered intact. Cord clamped and cut and baby placed on mothers abdomen. No fetal heart tones noted, no fetal tone. Bruising noted of head and left arm. Placenta in utero. Vagina with no lacerations. Bleeding stable. Fundus firm; pain controlled with iv stadol. Pt and husband left to visit with baby After 45mins with no sign of placenta ready for delivery, 800mcg of cytotec placed rectally. On exam an hour later placenta still noted in utero despite a few small gushes of blood. Pitocin started via protocol - 2x622mIU.  Placenta eventually delivered via D&E in OR. Pt stable

## 2015-04-09 LAB — OB RESULTS CONSOLE GC/CHLAMYDIA
CHLAMYDIA, DNA PROBE: NEGATIVE
GC PROBE AMP, GENITAL: NEGATIVE

## 2015-04-09 LAB — OB RESULTS CONSOLE ABO/RH: RH Type: POSITIVE

## 2015-04-09 LAB — OB RESULTS CONSOLE ANTIBODY SCREEN: Antibody Screen: NEGATIVE

## 2015-04-09 LAB — OB RESULTS CONSOLE RUBELLA ANTIBODY, IGM: Rubella: IMMUNE

## 2015-04-09 LAB — OB RESULTS CONSOLE HEPATITIS B SURFACE ANTIGEN: HEP B S AG: NEGATIVE

## 2015-04-09 LAB — OB RESULTS CONSOLE RPR: RPR: NONREACTIVE

## 2015-04-09 LAB — OB RESULTS CONSOLE HIV ANTIBODY (ROUTINE TESTING): HIV: NONREACTIVE

## 2015-05-08 ENCOUNTER — Other Ambulatory Visit (HOSPITAL_COMMUNITY): Payer: Self-pay | Admitting: Obstetrics and Gynecology

## 2015-05-08 ENCOUNTER — Encounter (HOSPITAL_COMMUNITY): Payer: Self-pay | Admitting: Obstetrics and Gynecology

## 2015-05-08 DIAGNOSIS — O09523 Supervision of elderly multigravida, third trimester: Secondary | ICD-10-CM

## 2015-05-08 DIAGNOSIS — Z3A15 15 weeks gestation of pregnancy: Secondary | ICD-10-CM

## 2015-05-08 DIAGNOSIS — O280 Abnormal hematological finding on antenatal screening of mother: Secondary | ICD-10-CM

## 2015-05-08 DIAGNOSIS — O351XX0 Maternal care for (suspected) chromosomal abnormality in fetus, not applicable or unspecified: Secondary | ICD-10-CM

## 2015-05-08 DIAGNOSIS — Z3689 Encounter for other specified antenatal screening: Secondary | ICD-10-CM

## 2015-05-08 DIAGNOSIS — IMO0002 Reserved for concepts with insufficient information to code with codable children: Secondary | ICD-10-CM

## 2015-05-13 ENCOUNTER — Encounter (HOSPITAL_COMMUNITY): Payer: Self-pay | Admitting: Obstetrics and Gynecology

## 2015-05-21 ENCOUNTER — Ambulatory Visit (HOSPITAL_COMMUNITY): Payer: Self-pay

## 2015-05-23 ENCOUNTER — Other Ambulatory Visit (HOSPITAL_COMMUNITY): Payer: Self-pay | Admitting: Obstetrics and Gynecology

## 2015-05-23 ENCOUNTER — Ambulatory Visit (HOSPITAL_COMMUNITY)
Admission: RE | Admit: 2015-05-23 | Discharge: 2015-05-23 | Disposition: A | Discharge status: Home / Self Care | Payer: BLUE CROSS/BLUE SHIELD | Source: Ambulatory Visit | Attending: Obstetrics and Gynecology | Admitting: Obstetrics and Gynecology

## 2015-05-23 ENCOUNTER — Encounter (HOSPITAL_COMMUNITY): Payer: Self-pay

## 2015-05-23 DIAGNOSIS — Z3A15 15 weeks gestation of pregnancy: Secondary | ICD-10-CM

## 2015-05-23 DIAGNOSIS — O283 Abnormal ultrasonic finding on antenatal screening of mother: Secondary | ICD-10-CM | POA: Diagnosis not present

## 2015-05-23 DIAGNOSIS — O358XX0 Maternal care for other (suspected) fetal abnormality and damage, not applicable or unspecified: Secondary | ICD-10-CM | POA: Insufficient documentation

## 2015-05-23 DIAGNOSIS — O09523 Supervision of elderly multigravida, third trimester: Secondary | ICD-10-CM

## 2015-05-23 DIAGNOSIS — Z315 Encounter for genetic counseling: Secondary | ICD-10-CM | POA: Diagnosis present

## 2015-05-23 DIAGNOSIS — O09522 Supervision of elderly multigravida, second trimester: Secondary | ICD-10-CM | POA: Diagnosis not present

## 2015-05-23 DIAGNOSIS — O289 Unspecified abnormal findings on antenatal screening of mother: Secondary | ICD-10-CM

## 2015-05-23 DIAGNOSIS — O280 Abnormal hematological finding on antenatal screening of mother: Secondary | ICD-10-CM

## 2015-05-23 DIAGNOSIS — O09529 Supervision of elderly multigravida, unspecified trimester: Secondary | ICD-10-CM

## 2015-05-23 DIAGNOSIS — IMO0002 Reserved for concepts with insufficient information to code with codable children: Secondary | ICD-10-CM

## 2015-05-23 DIAGNOSIS — Z3689 Encounter for other specified antenatal screening: Secondary | ICD-10-CM

## 2015-05-23 DIAGNOSIS — O351XX0 Maternal care for (suspected) chromosomal abnormality in fetus, not applicable or unspecified: Secondary | ICD-10-CM

## 2015-05-23 LAB — ROUTINE CHROMOSOME - KARYOTYPE + FISH

## 2015-05-23 NOTE — Progress Notes (Addendum)
Genetic Counseling  High-Risk Gestation Note  Appointment Date:  05/23/2015 Referred By: Lavina Hamman, MD Date of Birth:  [birth year] Partner:  Rosine Door   Pregnancy History: [id number] Estimated Date of Delivery: 11/09/15 Estimated Gestational Age: [redacted]w[redacted]d Attending: Rema Fendt, MD   Mrs. Laura Carroll and her husband, Mr. Rosine Door, were seen for genetic counseling because of a high risk for Trisomy 21 from noninvasive prenatal screening (NIPS). The patient is 40 y.o..   In Summary:   NIPS (Panorama) performed through [facility] office resulted as high risk for Trisomy 21  Positive predictive value estimated to be 99%, Given the patient's age and the previous nuchal translucency measurement through OB office  Reviewed chromosomes, Down syndrome, and the variable features and prognosis associated with Down syndrome  Discussed diagnostic testing option of amniocentesis including risks, benefits, and limitations  Mrs. Kupper elected to pursue amniocentesis today for FISH and karyotype analysis  Provided the couple with various resources regarding Down syndrome  If a diagnosis of Down syndrome is confirmed, reviewed options for pregnancy including continuing, adoption, and termination of pregnancy  Couple is currently undecided how they may proceed in pregnancy if Down syndrome diagnosis is confirmed via amniocentesis   Mrs. ATHEENA SPANO was offered NIPS (Panorama) because of advanced maternal age and increased nuchal translucency measurement on ultrasound through her OB office.  The results showed a high risk for fetal Down syndrome. We discussed that this testing identifies >99% of pregnancies with Down syndrome with a false positive rate of ~0.1%. We reviewed that this testing is highly sensitive and specific, but is not considered diagnostic. We reviewed that the positive predictive value (PPV) is the percentage of those patients who have a positive NIPS result who  actually have fetal Down syndrome. New literature Regino Schultze 2014) suggests that the PPV for Down syndrome (based on cytogenetic follow up) is 93% for women who are ~40 years of age. Specific to this laboratory and the patient's a priori risk based on age and ultrasound findings, the PPV is 99%.   We spend significant time reviewing this technology and the accuracy of NIPS and other screening versus diagnostic tests. We reviewed that the prenatal cell free DNA test can not distinguish between aneuploidy confined to the placenta, trisomy 21, translocation 21, or mosaic trisomy 21. We discussed the availability of amniocentesis or postnatal peripheral blood chromosome analysis for confirmation of the diagnosis. We discussed risks, benefits, and limitations of amniocentesis. We specifically discussed the associated 1 in 300-500 risk for complications, including spontaneous pregnancy loss. After careful consideration, Mrs. Mcadoo elected to proceed with amniocentesis today for [facility] for aneuploidy and karyotype analysis. See separate ultrasound report for documentation from today's procedure. We discussed the turnaround time of FISH and karyotype analysis.    Considering the very high suspicion of Down syndrome, they were counseled in detail regarding this diagnosis. We discussed that there are different types of Down syndrome, and each type is determined by the arrangement of the #21 chromosomes. Approximately 95% of cases of Down syndrome are trisomy 21 and 2-4% are due to a translocation involving chromosome 21. We reviewed chromosomes, nondisjunction, and that chromosome division errors happen by chance and are not usually inherited. They understand that confirmatory testing will provide an actual karyotype and will help to determine accurate risks of recurrence for a future pregnancy. We discussed that in the case that Down syndrome is confirmed, karyotype analysis is not able to determine which specific features of  Down syndrome  may be present or absent for an individual.   Down syndrome occurs once per every 800 births and is associated with specific features. We discussed that there is a high degree of variability seen among children who have this condition, meaning that every child with Down syndrome will not be affected in exactly the same way, and some children will have more or less features than others. They were counseled that approximately half of individuals with Down syndrome have a cardiac anomaly and ~10-15% have an intestinal difference. Other anomalies of various organ systems have also been described in association with this diagnosis. Detailed ultrasound and fetal echocardiogram are available in the pregnancy given these associations.   We discussed that in general most individuals with Down syndrome have mild to moderate mental retardation and likely will require extra assistance with school work. Approximately 70-80% of children with Down syndrome have hypotonia which may lead to delays in sitting, walking, and talking. Hypotonia does tend to improve with age and early intervention services such as physical, occupational, and speech therapies can help with achievement of developmental milestones. We discussed that these therapies are typically provided to children (with qualifying diagnoses) from birth until age 63. Down syndrome is associated with characteristic facial features. Because of these facial features, many children with Down syndrome look similar to each other, but they were reminded that each child with Down syndrome is unique and will have many more features in common with his or her own family members.   We also reviewed that the AAP have established health supervision guidelines for individuals with Down syndrome. These guidelines provide medical management recommendations for various stages of life and can be a resource for families who have a child with Down syndrome as well as for  health professionals. Providing children with Down syndrome with a stimulating physical and social environment, as well as ensuring that the child receives adequate medical care and appropriate therapies, will help these children to reach their full potential. With the advances in medical technology, early intervention, and supportive therapies, many individuals with Down syndrome are able to live with an increasing degree of independence. Today, many adults with Down syndrome care for themselves, have jobs, and often times live in group homes or apartments where assistance is available if needed.   We discussed local and national support organizations and provided the couple with written resources, as well as information for additional online resources. Additionally, postnatal health management can be coordinated by a medical geneticist as well as with a multidisciplinary team of physicians (Down syndrome clinic). We spent time discussing that once a pregnancy is found to have Down syndrome, there are different ways to proceed with the pregnancy including continuing the pregnancy with increased surveillance, adoption, and termination of pregnancy. We reviewed that the legal limit for termination of pregnancy in [place] is [redacted] weeks gestation. The couple indicated that they are considering termination of pregnancy if Down syndrome is confirmed in the pregnancy, but are currently undecided.   Mrs. ZEENA STARKEL was provided with written information regarding cystic fibrosis (CF) including the carrier frequency and incidence in the Caucasian population, the availability of carrier testing and prenatal diagnosis if indicated.  In addition, we discussed that CF is routinely screened for as part of the [place] newborn screening panel.  She declined CF testing today.   Both family histories were reviewed and found to be contributory for a maternal half-brother to the patient with congenital heart disease. He  died  at age 11019 years old from pneumonia, which developed following surgery. He was otherwise healthy. Congenital heart defects occur in approximately 1% of pregnancies.  Congenital heart defects may occur due to multifactorial influences, chromosomal abnormalities, genetic syndromes or environmental exposures.  Isolated heart defects are generally multifactorial.  The overall prognosis is dependent on the severity of the heart defect, and whether or not it is due to an underlying chromosome or genetic problem.  Chromosomal and syndromic etiologies may be associated with other birth defects and intellectual disabilities.  In the case of isolated occurrence and assuming multifactorial inheritance, the reported family history would not be expected to increase recurrence risk for congenital heart disease for a pregnancy for Mrs. Clegg.   The patient reported a distant paternal cousin with Down syndrome. She had limited information regarding this relative but reported that the relative is older than her. We reviewed that the majority of cases of Down syndrome occur due to nondisjunction and are sporadic. The reported family history is most consistent with sporadic occurrence for this relative. However, additional information regarding her chromosome or a different underlying medical condition may alter recurrence risk for relatives.   The patient reported a niece (her sister's daughter) with Asperger syndrome. Asperger is part of the spectrum of conditions referred to as Autistic spectrum disorders (ASD). ASDs are among the most common neurodevelopmental disorders, with approximately 1 in 68 children meeting criteria for ASD, according to the Centers for Disease Control. Approximately 80% of individuals diagnosed are female. There is strong evidence that genetic factors play a critical role in development of ASD. There have been recent advances in identifying specific genetic causes of ASD, however, there are still  many individuals for whom the etiology of the ASD is not known. Limited information is available regarding recurrence risk for extended degree relatives. They understand that at this time there is not genetic testing available for ASD for most families. Without further information regarding the provided family history, an accurate genetic risk cannot be calculated. Further genetic counseling is warranted if more information is obtained.  Mrs. Tobin ChadJennifer M Petitjean denied exposure to environmental toxins or chemical agents. She denied the use of alcohol, tobacco or street drugs. She denied significant viral illnesses during the course of her pregnancy. Her medical and surgical histories were noncontributory.   I counseled this couple regarding the above risks and available options.  The approximate face-to-face time with the genetic counselor was 40 minutes.  Quinn PlowmanKaren Eniya Cannady, MS,  Certified Genetic Counselor 05/23/2015

## 2015-05-26 ENCOUNTER — Telehealth (HOSPITAL_COMMUNITY): Payer: Self-pay | Admitting: MS"

## 2015-05-26 ENCOUNTER — Other Ambulatory Visit (HOSPITAL_COMMUNITY): Payer: Self-pay

## 2015-05-26 NOTE — Telephone Encounter (Signed)
Called [name] to discuss the preliminary FISH results from her amniocentesis. We reviewed that these are positive for Down syndrome, consistent with three copies of chromosome #21.  We again discussed the limitations of FISH and that final results are still pending and will be available in 1-2 weeks.  Patient reported that she has not noticed any concerning symptoms or complications following the procedure. Ms. Laura Carroll stated that she now needs time to think this over, let the result sink in, and decide where to go from here. Offered for the couple to come back for follow-up genetic counseling, if desired, and/or call me if it is helpful to talk through additional information in more detail.  All questions were answered to her satisfaction at this time.  Laura PlowmanKaren Flor Whitacre,  MS Certified Genetic Counselor 05/26/2015 12:27 PM

## 2015-05-27 ENCOUNTER — Telehealth (HOSPITAL_COMMUNITY): Payer: Self-pay | Admitting: MS"

## 2015-05-27 NOTE — Telephone Encounter (Signed)
Spoke with Dr. Eligha BridegroomMeisinger's nurse regarding abnormal FISH results from amniocentesis. She stated that they had received the lab report and that Dr. Jackelyn KnifeMeisinger is aware.   Clydie BraunKaren Daisy Lites 05/27/2015 11:01 AM

## 2015-06-03 ENCOUNTER — Telehealth (HOSPITAL_COMMUNITY): Payer: Self-pay | Admitting: MS"

## 2015-06-03 ENCOUNTER — Other Ambulatory Visit (HOSPITAL_COMMUNITY): Payer: Self-pay

## 2015-06-03 ENCOUNTER — Encounter (HOSPITAL_COMMUNITY): Payer: Self-pay

## 2015-06-03 NOTE — Telephone Encounter (Signed)
Left message for patient to return call to discuss final results.   Clydie BraunKaren Isaac Lacson 06/03/2015 1:57 PM

## 2015-06-04 ENCOUNTER — Telehealth (HOSPITAL_COMMUNITY): Payer: Self-pay | Admitting: Genetics

## 2015-06-04 ENCOUNTER — Encounter (HOSPITAL_COMMUNITY): Payer: Self-pay

## 2015-06-04 ENCOUNTER — Other Ambulatory Visit: Payer: Self-pay | Admitting: Obstetrics and Gynecology

## 2015-06-04 ENCOUNTER — Inpatient Hospital Stay (HOSPITAL_COMMUNITY)
Admission: AD | Admit: 2015-06-04 | Discharge: 2015-06-07 | DRG: 770 | Disposition: A | Discharge status: Home / Self Care | Payer: BLUE CROSS/BLUE SHIELD | Source: Ambulatory Visit | Attending: Obstetrics and Gynecology | Admitting: Obstetrics and Gynecology

## 2015-06-04 DIAGNOSIS — Z811 Family history of alcohol abuse and dependence: Secondary | ICD-10-CM

## 2015-06-04 DIAGNOSIS — O074 Failed attempted termination of pregnancy without complication: Secondary | ICD-10-CM | POA: Diagnosis not present

## 2015-06-04 DIAGNOSIS — Z87891 Personal history of nicotine dependence: Secondary | ICD-10-CM | POA: Diagnosis not present

## 2015-06-04 DIAGNOSIS — Z818 Family history of other mental and behavioral disorders: Secondary | ICD-10-CM

## 2015-06-04 DIAGNOSIS — O359XX0 Maternal care for (suspected) fetal abnormality and damage, unspecified, not applicable or unspecified: Secondary | ICD-10-CM | POA: Diagnosis present

## 2015-06-04 DIAGNOSIS — O351XX0 Maternal care for (suspected) chromosomal abnormality in fetus, not applicable or unspecified: Secondary | ICD-10-CM | POA: Diagnosis present

## 2015-06-04 DIAGNOSIS — Z3A17 17 weeks gestation of pregnancy: Secondary | ICD-10-CM

## 2015-06-04 DIAGNOSIS — Z332 Encounter for elective termination of pregnancy: Secondary | ICD-10-CM | POA: Diagnosis present

## 2015-06-04 DIAGNOSIS — Z825 Family history of asthma and other chronic lower respiratory diseases: Secondary | ICD-10-CM | POA: Diagnosis not present

## 2015-06-04 DIAGNOSIS — O029 Abnormal product of conception, unspecified: Secondary | ICD-10-CM

## 2015-06-04 LAB — CBC
HEMATOCRIT: 32.3 % — AB (ref 36.0–46.0)
HEMOGLOBIN: 11.2 g/dL — AB (ref 12.0–15.0)
MCH: 30.9 pg (ref 26.0–34.0)
MCHC: 34.7 g/dL (ref 30.0–36.0)
MCV: 89 fL (ref 78.0–100.0)
Platelets: 225 10*3/uL (ref 150–400)
RBC: 3.63 MIL/uL — ABNORMAL LOW (ref 3.87–5.11)
RDW: 13.8 % (ref 11.5–15.5)
WBC: 7.2 10*3/uL (ref 4.0–10.5)

## 2015-06-04 MED ORDER — SODIUM CHLORIDE 0.9 % IV SOLN: 250.0000 mL | INTRAVENOUS | Status: DC | PRN

## 2015-06-04 MED ORDER — LACTATED RINGERS IV SOLN
2.5000 [IU]/h | INTRAVENOUS | Status: DC
  Filled 2015-06-04: qty 4

## 2015-06-04 MED ORDER — OXYCODONE-ACETAMINOPHEN 5-325 MG PO TABS: 2.0000 | ORAL_TABLET | ORAL | Status: DC | PRN

## 2015-06-04 MED ORDER — PSEUDOEPHEDRINE HCL 30 MG PO TABS
30.0000 mg | ORAL_TABLET | Freq: Four times a day (QID) | ORAL | Status: DC | PRN
  Administered 2015-06-04: 30 mg via ORAL
  Filled 2015-06-04: qty 1

## 2015-06-04 MED ORDER — CITRIC ACID-SODIUM CITRATE 334-500 MG/5ML PO SOLN: 30.0000 mL | ORAL | Status: DC | PRN

## 2015-06-04 MED ORDER — BUTORPHANOL TARTRATE 1 MG/ML IJ SOLN
1.0000 mg | INTRAMUSCULAR | Status: DC | PRN
  Administered 2015-06-05 – 2015-06-06 (×10): 1 mg via INTRAVENOUS
  Filled 2015-06-04 (×10): qty 1

## 2015-06-04 MED ORDER — ZOLPIDEM TARTRATE 5 MG PO TABS
5.0000 mg | ORAL_TABLET | Freq: Every evening | ORAL | Status: DC | PRN
  Filled 2015-06-04: qty 1

## 2015-06-04 MED ORDER — OXYCODONE-ACETAMINOPHEN 5-325 MG PO TABS: 1.0000 | ORAL_TABLET | ORAL | Status: DC | PRN

## 2015-06-04 MED ORDER — ONDANSETRON HCL 4 MG/2ML IJ SOLN: 4.0000 mg | Freq: Four times a day (QID) | INTRAMUSCULAR | Status: DC | PRN

## 2015-06-04 MED ORDER — ACETAMINOPHEN 325 MG PO TABS
650.0000 mg | ORAL_TABLET | ORAL | Status: DC | PRN
  Administered 2015-06-06: 650 mg via ORAL
  Filled 2015-06-04: qty 2

## 2015-06-04 MED ORDER — MISOPROSTOL 200 MCG PO TABS
600.0000 ug | ORAL_TABLET | Freq: Four times a day (QID) | ORAL | Status: DC
  Administered 2015-06-04 – 2015-06-05 (×2): 600 ug via VAGINAL
  Filled 2015-06-04 (×2): qty 3

## 2015-06-04 MED ORDER — ONDANSETRON HCL 4 MG/2ML IJ SOLN: 4.0000 mg | Freq: Three times a day (TID) | INTRAMUSCULAR | Status: DC | PRN

## 2015-06-04 MED ORDER — OXYTOCIN BOLUS FROM INFUSION
500.0000 mL | INTRAVENOUS | Status: DC
  Administered 2015-06-06: 500 mL via INTRAVENOUS

## 2015-06-04 MED ORDER — LIDOCAINE HCL (PF) 1 % IJ SOLN: 30.0000 mL | INTRAMUSCULAR | Status: DC | PRN

## 2015-06-04 MED ORDER — SODIUM CHLORIDE 0.9% FLUSH: 3.0000 mL | Freq: Two times a day (BID) | INTRAVENOUS | Status: DC

## 2015-06-04 MED ORDER — SODIUM CHLORIDE 0.9% FLUSH: 3.0000 mL | INTRAVENOUS | Status: DC | PRN

## 2015-06-04 NOTE — Telephone Encounter (Signed)
Ms. Laura Carroll called, return [name]'s call from yesterday.  Patient was identified by full name and date of birth.  She stated that she had was aware from the preliminary results that it was most likely Down syndrome.  We discussed that the final results confirmed that suspicion.  She was counseled that the final amniocentesis results were consistent with a diagnosis of Trisomy 21, no translocation.  We briefly discussed the significance of this finding, including the recurrence chances. She stated that she was scheduled to be admitted tonight for pregnancy termination.  All of her questions were answered to her satisfaction.  She has [name]'s contact information should additional questions or concerns arise.

## 2015-06-05 ENCOUNTER — Other Ambulatory Visit (HOSPITAL_COMMUNITY): Payer: Self-pay

## 2015-06-05 LAB — TYPE AND SCREEN
ABO/RH(D): O POS
ANTIBODY SCREEN: NEGATIVE

## 2015-06-05 LAB — ABO/RH: ABO/RH(D): O POS

## 2015-06-05 MED ORDER — MISOPROSTOL 200 MCG PO TABS
800.0000 ug | ORAL_TABLET | Freq: Four times a day (QID) | ORAL | Status: DC
  Administered 2015-06-05 – 2015-06-06 (×3): 800 ug via VAGINAL
  Filled 2015-06-05 (×5): qty 4

## 2015-06-05 NOTE — Progress Notes (Signed)
Initial visit with Laura Carroll.  They shared that this was a difficult decision to come to and have concerns with how to share with family and friends.  They feel guilt, but do feel affirmed in their decision to "[facility]."  They don't want to lie to family and friends, but are afraid of being judged.  Chaplain reminded them that they don't have to share everything and simply saying that they delivered the baby early and he doesn't require them to explain any more or lie about what happened. They expressed gratitude for a way of reframing their story and admitted to feeling like they were "doubling their sin" by lying about the way events transpired.    At this time they are interested in cremation and I gave them information about [employer].  They do not know if they want any textiles (clothing, blankets, etc) but do want to have hand and footprints made.  Please page as further needs arise.  Laura Carroll, M.Div. [facility] Chaplain Pager [phone] Office [phone]     06/05/15 1000  Clinical Encounter Type  Visited With Patient and family together  Visit Type Initial;Spiritual support  Referral From Chaplain;Nurse  Spiritual Encounters  Spiritual Needs Emotional;Grief support  Stress Factors  Patient Stress Factors Loss

## 2015-06-05 NOTE — Progress Notes (Signed)
Feeling some cramping Afeb, VSS VE-closed, soft, at least 50%effaced, cytotec placed in vault Will continue with cytotec q 6 hrs

## 2015-06-05 NOTE — Progress Notes (Signed)
Pt was awake and lying in bed when I arrived. Husband was bedside. She spoke of how difficult their decision had been to this point. They are still looking for closure when baby arrives and have decided to cremate using [employer] services. They spoke of their children and church home. They are relying on their faith in God to find peace and rest in knowing their baby will be with Him in heaven.  Please page if additional support is needed. Chaplain Marjory LiesPamela Carrington Holder, M.Div.   06/05/15 2100  Clinical Encounter Type  Visited With Patient and family together

## 2015-06-05 NOTE — H&P (Signed)
Laura Carroll is a 40 y.o. female, G5 P3013, EGA [redacted] weeks with EDC 10-8 presenting for termination due to baby with Trisomy 21.  Baby had slightly thickened nuchal thickness, Panorama high risk fro Trisomy 21, amniocentesis confirms Trisomy 3121.  Options have been discussed and she wishes to proceed with cytotec induction.  She was admitted last pm and has received 2 doses of cytotec 600 mcg, feeling some mild cramping.   History OB History    Gravida Para Term Preterm AB TAB SAB Ectopic Multiple Living   5 3 3  0 1 0 1 0 0 3     Past Medical History  Diagnosis Date  . Abnormal Pap smear    Past Surgical History  Procedure Laterality Date  . Wisdom tooth extraction    . Leep    . Dental implant      c section  . Cesarean section  06/01/2011    Procedure: CESAREAN SECTION;  Surgeon: Lavina Hammanodd Vona Whiters, MD;  Location: [facility];  Service: Gynecology;  Laterality: N/A;   Family History: family history includes Alcohol abuse in her mother; Anxiety disorder in her mother; COPD in her maternal grandmother; Cancer in her maternal aunt and maternal grandfather; Mental illness in her mother. Social History:  reports that she has quit smoking. She has never used smokeless tobacco. She reports that she does not drink alcohol or use illicit drugs.   Review of Systems  Respiratory: Negative.   Cardiovascular: Negative.     Dilation: Closed Effacement (%): Thick Station: -2 Exam by:: k. [email or web address] Blood pressure 117/57, pulse 82, temperature 98 F (36.7 C), temperature source Oral, resp. rate 18, height 5\' 6"  (1.676 m), weight 66.679 kg (147 lb), last menstrual period 02/02/2015, unknown if currently breastfeeding. Exam Physical Exam  Vitals reviewed. Constitutional: She appears well-developed and well-nourished.  Cardiovascular: Normal rate, regular rhythm and normal heart sounds.   No murmur heard. Respiratory: Effort normal and breath sounds normal. No respiratory distress. She has no wheezes.   GI: Soft.    Prenatal labs: ABO, Rh: --/--/O POS, O POS (05/03 2309) Antibody: NEG (05/03 2309) Rubella: Immune (03/08 0000) RPR: Nonreactive (03/08 0000)  HBsAg: Negative (03/08 0000)  HIV: Non-reactive (03/08 0000)  GBS:     Assessment/Plan: IUP at 17 weeks with Trisomy 21 admitted for termination.  She has received 2 doses of cytotec 600 mcg, feeling some cramping.  Will increase cytotec to 800 mcg q 6 hours and monitor progress.  Discussed possible need for Carroll&C for retained placenta.   Laura Carroll 06/05/2015, 7:56 AM

## 2015-06-05 NOTE — Progress Notes (Signed)
Follow up visit with Victorino DikeJennifer and her husband.  Victorino DikeJennifer stated that the pain has intensified a bit, but not much.  She is trying to rest.  Pt initially stated she did not want any clothing or hats for baby, but then her husband suggested it might keep him warm, so she said she'd like to look at them.  I dropped off a couple of small hats, pouches, a blanket, mini diapers, and a gown for the family to look at.  I notified them that they may choose to use some of the items or not to use any of them.   I informed the family that we are switching to our night shift chaplain and they are aware of how to request services.  Notified Darcus PesterPamela Holder, Chaplain that family is here.     Please page as further needs arise.  Maryanna ShapeAmanda M. Carley Hammedavee Lomax, M.Div. [facility] Chaplain Pager [phone] Office [phone]     06/05/15 1614  Clinical Encounter Type  Visited With Patient and family together  Visit Type Follow-up;Spiritual support  Referral From Chaplain  Consult/Referral To Chaplain  Spiritual Encounters  Spiritual Needs Emotional;Grief support  Stress Factors  Patient Stress Factors Loss

## 2015-06-05 NOTE — Telephone Encounter (Signed)
Patient returned call 5/03. Final karyotype result from amniocentesis discussed with patient by Mady Gemmaaragh Conrad, MS, CGC.   Laura Carroll 06/05/2015

## 2015-06-06 ENCOUNTER — Inpatient Hospital Stay (HOSPITAL_COMMUNITY): Payer: BLUE CROSS/BLUE SHIELD | Admitting: Certified Registered Nurse Anesthetist

## 2015-06-06 ENCOUNTER — Inpatient Hospital Stay (HOSPITAL_COMMUNITY): Payer: BLUE CROSS/BLUE SHIELD

## 2015-06-06 ENCOUNTER — Encounter (HOSPITAL_COMMUNITY): Payer: Self-pay | Admitting: *Deleted

## 2015-06-06 ENCOUNTER — Encounter (HOSPITAL_COMMUNITY)
Admission: AD | Disposition: A | Discharge status: Home / Self Care | Payer: Self-pay | Source: Ambulatory Visit | Attending: Obstetrics and Gynecology

## 2015-06-06 HISTORY — PX: DILATION AND CURETTAGE OF UTERUS: SHX78

## 2015-06-06 SURGERY — DILATION AND CURETTAGE
Anesthesia: Spinal | Site: Uterus

## 2015-06-06 MED ORDER — MIDAZOLAM HCL 2 MG/2ML IJ SOLN
INTRAMUSCULAR | Status: AC
  Filled 2015-06-06: qty 2

## 2015-06-06 MED ORDER — PHENYLEPHRINE HCL 10 MG/ML IJ SOLN
INTRAMUSCULAR | Status: DC | PRN
  Administered 2015-06-06 (×3): 40 ug via INTRAVENOUS

## 2015-06-06 MED ORDER — COCONUT OIL OIL: 1.0000 "application " | TOPICAL_OIL | Status: DC | PRN

## 2015-06-06 MED ORDER — LIDOCAINE HCL 1 % IJ SOLN
INTRAMUSCULAR | Status: AC
  Filled 2015-06-06: qty 20

## 2015-06-06 MED ORDER — LACTATED RINGERS IV SOLN
INTRAVENOUS | Status: DC
  Administered 2015-06-06 (×2): via INTRAVENOUS

## 2015-06-06 MED ORDER — TETANUS-DIPHTH-ACELL PERTUSSIS 5-2.5-18.5 LF-MCG/0.5 IM SUSP: 0.5000 mL | Freq: Once | INTRAMUSCULAR | Status: DC

## 2015-06-06 MED ORDER — GLYCOPYRROLATE 0.2 MG/ML IJ SOLN
INTRAMUSCULAR | Status: AC
  Filled 2015-06-06: qty 1

## 2015-06-06 MED ORDER — BENZOCAINE-MENTHOL 20-0.5 % EX AERO: 1.0000 "application " | INHALATION_SPRAY | CUTANEOUS | Status: DC | PRN

## 2015-06-06 MED ORDER — ONDANSETRON HCL 4 MG PO TABS: 4.0000 mg | ORAL_TABLET | ORAL | Status: DC | PRN

## 2015-06-06 MED ORDER — MIDAZOLAM HCL 2 MG/2ML IJ SOLN
INTRAMUSCULAR | Status: DC | PRN
  Administered 2015-06-06: 0.5 mg via INTRAVENOUS
  Administered 2015-06-06: 1 mg via INTRAVENOUS
  Administered 2015-06-06: 0.5 mg via INTRAVENOUS

## 2015-06-06 MED ORDER — ONDANSETRON HCL 4 MG/2ML IJ SOLN
INTRAMUSCULAR | Status: DC | PRN
  Administered 2015-06-06: 4 mg via INTRAVENOUS

## 2015-06-06 MED ORDER — ONDANSETRON HCL 4 MG/2ML IJ SOLN
INTRAMUSCULAR | Status: AC
  Filled 2015-06-06: qty 2

## 2015-06-06 MED ORDER — OXYCODONE HCL 5 MG PO TABS: 10.0000 mg | ORAL_TABLET | ORAL | Status: DC | PRN

## 2015-06-06 MED ORDER — PHENYLEPHRINE 40 MCG/ML (10ML) SYRINGE FOR IV PUSH (FOR BLOOD PRESSURE SUPPORT)
PREFILLED_SYRINGE | INTRAVENOUS | Status: AC
  Filled 2015-06-06: qty 10

## 2015-06-06 MED ORDER — OXYTOCIN 10 UNIT/ML IJ SOLN
INTRAVENOUS | Status: AC
  Filled 2015-06-06: qty 4

## 2015-06-06 MED ORDER — DIBUCAINE 1 % RE OINT: 1.0000 "application " | TOPICAL_OINTMENT | RECTAL | Status: DC | PRN

## 2015-06-06 MED ORDER — IBUPROFEN 600 MG PO TABS
600.0000 mg | ORAL_TABLET | Freq: Four times a day (QID) | ORAL | Status: DC
  Administered 2015-06-07: 600 mg via ORAL
  Filled 2015-06-06: qty 1

## 2015-06-06 MED ORDER — DOXYCYCLINE HYCLATE 100 MG PO TABS
100.0000 mg | ORAL_TABLET | Freq: Two times a day (BID) | ORAL | Status: DC
  Administered 2015-06-06 – 2015-06-07 (×2): 100 mg via ORAL
  Filled 2015-06-06 (×2): qty 1

## 2015-06-06 MED ORDER — ZOLPIDEM TARTRATE 5 MG PO TABS
5.0000 mg | ORAL_TABLET | Freq: Every evening | ORAL | Status: DC | PRN
  Administered 2015-06-06: 5 mg via ORAL
  Filled 2015-06-06: qty 1

## 2015-06-06 MED ORDER — FENTANYL CITRATE (PF) 100 MCG/2ML IJ SOLN
INTRAMUSCULAR | Status: AC
  Filled 2015-06-06: qty 2

## 2015-06-06 MED ORDER — PROPOFOL 10 MG/ML IV BOLUS
INTRAVENOUS | Status: AC
  Filled 2015-06-06: qty 20

## 2015-06-06 MED ORDER — OXYTOCIN 10 UNIT/ML IJ SOLN
1.0000 m[IU]/min | INTRAMUSCULAR | Status: DC
  Administered 2015-06-06: 2 m[IU]/min via INTRAVENOUS

## 2015-06-06 MED ORDER — WITCH HAZEL-GLYCERIN EX PADS: 1.0000 "application " | MEDICATED_PAD | CUTANEOUS | Status: DC | PRN

## 2015-06-06 MED ORDER — ONDANSETRON HCL 4 MG/2ML IJ SOLN: 4.0000 mg | INTRAMUSCULAR | Status: DC | PRN

## 2015-06-06 MED ORDER — PRENATAL MULTIVITAMIN CH: 1.0000 | ORAL_TABLET | Freq: Every day | ORAL | Status: DC

## 2015-06-06 MED ORDER — FENTANYL CITRATE (PF) 100 MCG/2ML IJ SOLN: 100.0000 ug | INTRAMUSCULAR | Status: DC | PRN

## 2015-06-06 MED ORDER — OXYCODONE HCL 5 MG PO TABS: 5.0000 mg | ORAL_TABLET | ORAL | Status: DC | PRN

## 2015-06-06 MED ORDER — METHYLERGONOVINE MALEATE 0.2 MG/ML IJ SOLN: 0.2000 mg | INTRAMUSCULAR | Status: DC | PRN

## 2015-06-06 MED ORDER — BUPIVACAINE IN DEXTROSE 0.75-8.25 % IT SOLN
INTRATHECAL | Status: DC | PRN
  Administered 2015-06-06: 1.4 mL via INTRATHECAL

## 2015-06-06 MED ORDER — METHYLERGONOVINE MALEATE 0.2 MG PO TABS: 0.2000 mg | ORAL_TABLET | ORAL | Status: DC | PRN

## 2015-06-06 MED ORDER — DIPHENHYDRAMINE HCL 25 MG PO CAPS: 25.0000 mg | ORAL_CAPSULE | Freq: Four times a day (QID) | ORAL | Status: DC | PRN

## 2015-06-06 MED ORDER — MISOPROSTOL 200 MCG PO TABS
800.0000 ug | ORAL_TABLET | Freq: Once | ORAL | Status: AC
  Administered 2015-06-06: 800 ug via RECTAL

## 2015-06-06 MED ORDER — TERBUTALINE SULFATE 1 MG/ML IJ SOLN: 0.2500 mg | Freq: Once | INTRAMUSCULAR | Status: DC | PRN

## 2015-06-06 MED ORDER — SENNOSIDES-DOCUSATE SODIUM 8.6-50 MG PO TABS: 2.0000 | ORAL_TABLET | ORAL | Status: DC

## 2015-06-06 MED ORDER — LIDOCAINE HCL 1 % IJ SOLN
INTRAMUSCULAR | Status: DC | PRN
  Administered 2015-06-06: 20 mL

## 2015-06-06 MED ORDER — ACETAMINOPHEN 325 MG PO TABS: 650.0000 mg | ORAL_TABLET | ORAL | Status: DC | PRN

## 2015-06-06 MED ORDER — SIMETHICONE 80 MG PO CHEW: 80.0000 mg | CHEWABLE_TABLET | ORAL | Status: DC | PRN

## 2015-06-06 MED ORDER — FENTANYL CITRATE (PF) 100 MCG/2ML IJ SOLN: 25.0000 ug | INTRAMUSCULAR | Status: DC | PRN

## 2015-06-06 SURGICAL SUPPLY — 16 items
CLOTH BEACON ORANGE TIMEOUT ST (SAFETY) ×4 IMPLANT
DECANTER SPIKE VIAL GLASS SM (MISCELLANEOUS) ×4 IMPLANT
GLOVE BIO SURGEON STRL SZ 6.5 (GLOVE) ×3 IMPLANT
GLOVE BIO SURGEONS STRL SZ 6.5 (GLOVE) ×1
GLOVE BIOGEL PI IND STRL 7.0 (GLOVE) ×4 IMPLANT
GLOVE BIOGEL PI INDICATOR 7.0 (GLOVE) ×4
GLOVE SURG SS PI 7.0 STRL IVOR (GLOVE) ×12 IMPLANT
GOWN STRL REUS W/TWL LRG LVL3 (GOWN DISPOSABLE) ×8 IMPLANT
KIT BERKELEY 1ST TRIMESTER 3/8 (MISCELLANEOUS) ×4 IMPLANT
KIT BERKELEY 2ND TRIMESTER 1/2 (COLLECTOR) ×4 IMPLANT
NS IRRIG 1000ML POUR BTL (IV SOLUTION) ×4 IMPLANT
PACK VAGINAL MINOR WOMEN LF (CUSTOM PROCEDURE TRAY) ×4 IMPLANT
PAD OB MATERNITY 4.3X12.25 (PERSONAL CARE ITEMS) ×4 IMPLANT
PAD PREP 24X48 CUFFED NSTRL (MISCELLANEOUS) ×4 IMPLANT
TOWEL OR 17X24 6PK STRL BLUE (TOWEL DISPOSABLE) ×8 IMPLANT
TRAY FOLEY CATH SILVER 14FR (SET/KITS/TRAYS/PACK) ×4 IMPLANT

## 2015-06-06 NOTE — Transfer of Care (Signed)
Immediate Anesthesia Transfer of Care Note  Patient: Laura Carroll  Procedure(s) Performed: Procedure(s) with comments: DILATATION AND CURETTAGE (N/A) - with ultrasound guidence   Patient Location: PACU  Anesthesia Type:Spinal  Level of Consciousness: awake, alert , oriented and patient cooperative  Airway & Oxygen Therapy: Patient Spontanous Breathing  Post-op Assessment: Report given to RN and Post -op Vital signs reviewed and stable  Post vital signs: Reviewed and stable  Last Vitals:  Filed Vitals:   06/06/15 1547 06/06/15 1645  BP: 112/65   Pulse: 71   Temp:  36.6 C  Resp: 16     Last Pain:  Filed Vitals:   06/06/15 1647  PainSc: 6          Complications: No apparent anesthesia complications

## 2015-06-06 NOTE — Progress Notes (Signed)
Continue to wait on delivery of placenta.  Cytotec 800mg  PR given.

## 2015-06-06 NOTE — Progress Notes (Signed)
Attempt to remove placenta manually.  Pt placed in stirrups.  SSE and rings used.  Total EBL since delivery approx 500cc.  Removal unsuccessful.  POC to OR initiated.  Anesthesia and the OR notified.

## 2015-06-06 NOTE — Consult Note (Signed)
  Pre op note  After 6 hours placenta still not delivered despite cytotec and pitocin. Attempted manual extraction but unsuccessful. Thin stringy membranes at os but placenta still in utero. Counseled pt about recommendation for D&E. Risk of infection, bleeding injury with surgery reviewed.  Pt consents verbally and on paper. Staff notified. TO OR for removal of retained placenta

## 2015-06-06 NOTE — Progress Notes (Signed)
Feeling cramps Afeb, VSS VE is difficult, used speculum to visualize and remove accumulated cytotec, cervix is posterior, firm but softening, almost 1 cm dilated Will continue cytotec, re-eval in am and discuss with MFM if no significant change

## 2015-06-06 NOTE — Anesthesia Postprocedure Evaluation (Signed)
Anesthesia Post Note  Patient: Laura Carroll  Procedure(s) Performed: Procedure(s) (LRB): DILATATION AND CURETTAGE (N/A)  Patient location during evaluation: PACU Anesthesia Type: Spinal Level of consciousness: oriented and awake and alert Pain management: pain level controlled Vital Signs Assessment: post-procedure vital signs reviewed and stable Respiratory status: spontaneous breathing, respiratory function stable and patient connected to nasal cannula oxygen Cardiovascular status: blood pressure returned to baseline and stable Postop Assessment: no headache, no backache and spinal receding Anesthetic complications: no     Last Vitals:  Filed Vitals:   06/06/15 1730 06/06/15 1745  BP: 95/39 100/44  Pulse: 72 73  Temp:    Resp: 16 13    Last Pain:  Filed Vitals:   06/06/15 1759  PainSc: 6    Pain Goal:                 Reino KentJudd, Romesha Scherer J

## 2015-06-06 NOTE — Anesthesia Procedure Notes (Signed)
Spinal Patient location during procedure: OR Staffing Anesthesiologist: Cecile HearingURK, Anirudh Baiz EDWARD Performed by: anesthesiologist  Preanesthetic Checklist Completed: patient identified, surgical consent, pre-op evaluation, timeout performed, IV checked, risks and benefits discussed and monitors and equipment checked Spinal Block Patient position: sitting Prep: site prepped and draped and DuraPrep Patient monitoring: continuous pulse ox and blood pressure Approach: midline Location: L3-4 Needle Needle type: Pencan  Needle gauge: 24 G Assessment Sensory level: T8 Additional Notes Functioning IV was confirmed and monitors were applied. Sterile prep and drape, including hand hygiene, mask and sterile gloves were used. The patient was positioned and the spine was prepped. The skin was anesthetized with lidocaine.  Free flow of clear CSF was obtained prior to injecting local anesthetic into the CSF.  The spinal needle aspirated freely following injection.  The needle was carefully withdrawn.  The patient tolerated the procedure well. Consent was obtained prior to procedure with all questions answered and concerns addressed. Risks including but not limited to bleeding, infection, nerve damage, paralysis, failed block, inadequate analgesia, allergic reaction, high spinal, itching and headache were discussed and the patient wished to proceed.   Arrie AranStephen Soriah Leeman, MD

## 2015-06-06 NOTE — Progress Notes (Signed)
Pt was lying in bed holding infant [name]' remains [facility]. She said they were coping ok. Husband was bedside. Since we covered a lot the night before, they did not have any questions but wanted prayer. Following prayer they pt was appropriately tearful; she and husband were thankful for visit and prayer. Please page if additional support is needed. Chaplain Marjory LiesPamela Carrington Holder, M.Div.   06/06/15 1100  Clinical Encounter Type  Visited With Patient and family together

## 2015-06-06 NOTE — Progress Notes (Signed)
Infant to morgue.

## 2015-06-06 NOTE — Progress Notes (Signed)
Anesthesia at bedside

## 2015-06-06 NOTE — Anesthesia Preprocedure Evaluation (Signed)
Anesthesia Evaluation  Patient identified by MRN, date of birth, ID band Patient awake    Reviewed: Allergy & Precautions, NPO status , Patient's Chart, lab work & pertinent test results  Airway Mallampati: I  TM Distance: >3 FB Neck ROM: Full    Dental  (+) Teeth Intact, Dental Advisory Given, Implants,    Pulmonary neg pulmonary ROS, former smoker,    Pulmonary exam normal breath sounds clear to auscultation       Cardiovascular negative cardio ROS Normal cardiovascular exam Rhythm:Regular Rate:Normal     Neuro/Psych negative neurological ROS     GI/Hepatic negative GI ROS, Neg liver ROS,   Endo/Other  negative endocrine ROS  Renal/GU negative Renal ROS     Musculoskeletal negative musculoskeletal ROS (+)   Abdominal   Peds  Hematology negative hematology ROS (+)   Anesthesia Other Findings Day of surgery medications reviewed with the patient.  Reproductive/Obstetrics 17wk IUD, retained placenta                             Anesthesia Physical Anesthesia Plan  ASA: II and emergent  Anesthesia Plan: Spinal   Post-op Pain Management:    Induction:   Airway Management Planned:   Additional Equipment:   Intra-op Plan:   Post-operative Plan:   Informed Consent: I have reviewed the patients History and Physical, chart, labs and discussed the procedure including the risks, benefits and alternatives for the proposed anesthesia with the patient or authorized representative who has indicated his/her understanding and acceptance.   Dental advisory given  Plan Discussed with: CRNA, Anesthesiologist and Surgeon  Anesthesia Plan Comments: (Discussed risks and benefits of and differences between spinal and general. Discussed risks of spinal including headache, backache, failure, bleeding, infection, and nerve damage. Patient consents to spinal. Questions answered. Coagulation studies and  platelet count acceptable.)        Anesthesia Quick Evaluation

## 2015-06-06 NOTE — Op Note (Signed)
Preop - retained placenta Post op - same Procedure - [place] guided D&E Complications - none EBL - 200ml Findings - retained fundal placenta; anterior uterus Disposition - stable to recovery   Patient was taken to the operating room where spinal anesthesia was obtained without difficulty. She was then prepped and draped in the normal sterile fashion after being placed in the dorsal lithotomy position. A foley catheter was placed for bladder decompression.  An appropriate time out was performed. A weighted speculum was then placed within the vagina and an anterior sims in the anterior aspect of the vagina. The cervix was identified and a ring forceps used to tease out placental tissue noted at the os. Placental tissue was evacuated after several passes but appeared to be more further in utero. Bedside us confirmed this and further evacuation was done with us guidance. Placenta was attached at fundus. A gentle sharp curettage removed the last remaining bit. Bleeding was stable.  A gritty texture noted in all 4 quadrants. At this time all instruments were removed from the vagina.  Pt tolerated the procedure well. She was  taken to the recovery room in good condition. Counts correct Counts correct per nursing staff

## 2015-06-07 LAB — CBC
HCT: 21.9 % — ABNORMAL LOW (ref 36.0–46.0)
HEMOGLOBIN: 7.7 g/dL — AB (ref 12.0–15.0)
MCH: 31.2 pg (ref 26.0–34.0)
MCHC: 35.2 g/dL (ref 30.0–36.0)
MCV: 88.7 fL (ref 78.0–100.0)
PLATELETS: 181 10*3/uL (ref 150–400)
RBC: 2.47 MIL/uL — ABNORMAL LOW (ref 3.87–5.11)
RDW: 14 % (ref 11.5–15.5)
WBC: 7.8 10*3/uL (ref 4.0–10.5)

## 2015-06-07 MED ORDER — FERROUS SULFATE 325 (65 FE) MG PO TABS: 325.0000 mg | ORAL_TABLET | Freq: Two times a day (BID) | ORAL | Status: DC

## 2015-06-07 MED ORDER — DOXYCYCLINE HYCLATE 100 MG PO TABS: 100.0000 mg | ORAL_TABLET | Freq: Two times a day (BID) | ORAL | Status: DC

## 2015-06-07 MED ORDER — IBUPROFEN 600 MG PO TABS: 600.0000 mg | ORAL_TABLET | Freq: Four times a day (QID) | ORAL | Status: DC

## 2015-06-07 NOTE — Progress Notes (Signed)

## 2015-06-07 NOTE — Discharge Instructions (Signed)
Nothing in vagina for 6 weeks.  No sex, tampons, and douching.  Other instructions as in [facility] Discharge Booklet. °

## 2015-06-07 NOTE — Discharge Summary (Signed)
OB Discharge Summary     Patient Name: Laura ChadJennifer M Ikeda DOB: 1975/05/20 MRN: [id number]  Date of admission: 06/04/2015 Delivering MD: Pryor OchoaBANGA, Arwyn [facility]   Date of discharge: 06/07/2015  Admitting diagnosis: direct admit retained products  Intrauterine pregnancy: 1627w5d     Secondary diagnosis:  Active Problems:   Fetal abnormality affecting management of mother   Postpartum care following vaginal delivery  Additional problems: anemia      Discharge diagnosis: Preterm Pregnancy Delivered, Anemia and s/p D&E                                                                                                Post partum procedures:curettage   Augmentation: Cytotec  Complications: None  Hospital course:  Induction of Labor With Vaginal Delivery   40 y.o. yo [id number] at 6727w5d was admitted to the hospital 06/04/2015 for induction of labor.  Indication for induction: elective termination.  Patient had an uncomplicated labor course as follows: Membrane Rupture Time/Date: 4:15 AM ,06/06/2015   Intrapartum Procedures: Episiotomy: None [1]                                         Lacerations:  None [1]  Patient had delivery of a Non Viable infant.  Information for the patient's newborn:  Kari BaarsMcNeal, PendingBaby FD [[id number]][[id number]]      06/06/2015  Details of delivery can be found in separate delivery note.  Patient had a routine postpartum course. Patient is discharged home 06/07/2015.   Physical exam  Filed Vitals:   06/06/15 1814 06/06/15 1917 06/06/15 2151 06/07/15 0515  BP: 100/44 95/41 95/74  100/49  Pulse: 64 75 89 65  Temp: 98.2 F (36.8 C) 98.4 F (36.9 C) 98.6 F (37 C) 97.9 F (36.6 C)  TempSrc:      Resp:  18 14 16   Height:      Weight:      SpO2: 100% 98% 98% 98%   General: alert, cooperative and no distress Lochia: appropriate Uterine Fundus: firm Incision: N/A DVT Evaluation: No evidence of DVT seen on physical exam. Labs: Lab Results  Component Value Date   WBC 7.8  06/07/2015   HGB 7.7* 06/07/2015   HCT 21.9* 06/07/2015   MCV 88.7 06/07/2015   PLT 181 06/07/2015   No flowsheet data found.  Discharge instruction: per After Visit Summary and "Baby and Me Booklet".  After visit meds:    Medication List    STOP taking these medications        prenatal multivitamin Tabs tablet      TAKE these medications        calcium carbonate 500 MG chewable tablet  Commonly known as:  TUMS - dosed in mg elemental calcium  Chew 1 tablet by mouth daily as needed for indigestion or heartburn.     CLARITIN PO  Take by mouth.     doxycycline 100 MG tablet  Commonly known as:  VIBRA-TABS  Take 1 tablet (100 mg total) by mouth  2 (two) times daily.     ferrous sulfate 325 (65 FE) MG tablet  Take 1 tablet (325 mg total) by mouth 2 (two) times daily with a meal.     ibuprofen 600 MG tablet  Commonly known as:  ADVIL,MOTRIN  Take 1 tablet (600 mg total) by mouth every 6 (six) hours.     pseudoephedrine 30 MG tablet  Commonly known as:  SUDAFED  Take 30 mg by mouth once.        Diet: routine diet  Activity: Advance as tolerated. Pelvic rest for 6 weeks.   Outpatient follow up:1-2weeks Follow up Appt:No future appointments. Follow up Visit:No Follow-up on file.  Postpartum contraception: Not Discussed  Newborn Data: Live born female  Birth Weight: 7.4 oz (210 g) APGAR: 0, 0   Disposition:deceased   06-11-15 Edwinna Areola, DO

## 2015-06-07 NOTE — Progress Notes (Signed)
Patient ID: Laura Carroll, female   DOB: [birth year], 40 y.o.   MRN: [id number] Pt has been crying this am. States pain well controlled and scant lochia. Denies any fever/chills. States still confident made right decision but "i feel bad for him" referring to baby [name]. Husband is present and supportive. States have his mother and their Church members as support VSS ABD- NT EXT - no homans  Hg - 7.7 ( down from 11)  A/P: PPD # 1 s/p svd at 17weeks and postpartum D&E for retained placenta         Reviewed discharge instructions including signs of postpartum depression         To follow up in office in 1-2weeks to see how well she is coping

## 2015-06-07 NOTE — Addendum Note (Signed)
Addendum  created 06/07/15 0830 by Shanon PayorSuzanne M Aberdeen Hafen, CRNA   Modules edited: Clinical Notes   Clinical Notes:  File: [id number]; Pend: [id number]

## 2015-06-07 NOTE — Anesthesia Postprocedure Evaluation (Signed)
Anesthesia Post Note  Patient: Laura Carroll  Procedure(s) Performed: Procedure(s) (LRB): DILATATION AND CURETTAGE (N/A)  Patient location during evaluation: [facility] Unit Anesthesia Type: Spinal Level of consciousness: awake and alert and oriented Pain management: pain level controlled Vital Signs Assessment: post-procedure vital signs reviewed and stable Respiratory status: spontaneous breathing and nonlabored ventilation Cardiovascular status: stable Postop Assessment: no headache, no backache, patient able to bend at knees, no signs of nausea or vomiting and adequate PO intake Anesthetic complications: no     Last Vitals:  Filed Vitals:   06/06/15 2151 06/07/15 0515  BP: 95/74 100/49  Pulse: 89 65  Temp: 37 C 36.6 C  Resp: 14 16    Last Pain:  Filed Vitals:   06/07/15 0635  PainSc: 1    Pain Goal: Patients Stated Pain Goal: 3 (06/07/15 0600)               Madison HickmanGREGORY,Nakul Avino

## 2015-06-08 ENCOUNTER — Encounter (HOSPITAL_COMMUNITY): Payer: Self-pay | Admitting: Obstetrics and Gynecology

## 2016-09-21 LAB — OB RESULTS CONSOLE HIV ANTIBODY (ROUTINE TESTING): HIV: NONREACTIVE

## 2016-09-21 LAB — OB RESULTS CONSOLE RUBELLA ANTIBODY, IGM: Rubella: IMMUNE

## 2016-09-21 LAB — OB RESULTS CONSOLE HEPATITIS B SURFACE ANTIGEN: Hepatitis B Surface Ag: NEGATIVE

## 2016-09-21 LAB — OB RESULTS CONSOLE ANTIBODY SCREEN: Antibody Screen: NEGATIVE

## 2016-09-21 LAB — OB RESULTS CONSOLE GC/CHLAMYDIA
CHLAMYDIA, DNA PROBE: NEGATIVE
Gonorrhea: NEGATIVE

## 2016-09-21 LAB — OB RESULTS CONSOLE ABO/RH: RH TYPE: POSITIVE

## 2016-09-21 LAB — OB RESULTS CONSOLE RPR: RPR: NONREACTIVE

## 2016-11-15 ENCOUNTER — Encounter: Payer: Self-pay | Admitting: Physical Therapy

## 2016-11-15 ENCOUNTER — Ambulatory Visit: Payer: Medicaid Other | Attending: Obstetrics and Gynecology | Admitting: Physical Therapy

## 2016-11-15 DIAGNOSIS — M6281 Muscle weakness (generalized): Secondary | ICD-10-CM | POA: Diagnosis present

## 2016-11-15 DIAGNOSIS — M62830 Muscle spasm of back: Secondary | ICD-10-CM | POA: Diagnosis present

## 2016-11-15 DIAGNOSIS — M545 Low back pain, unspecified: Secondary | ICD-10-CM

## 2016-11-15 NOTE — Therapy (Signed)
[facility] [address] [place], [place], [address] Phone: [phone]   Fax:  [phone]  Physical Therapy Evaluation  Patient Details  Name: Laura Carroll MRN: [id number] Date of Birth: 24-Aug-1975 Referring Provider: Huel Cote MD  Encounter Date: 11/15/2016      PT End of Session - 11/15/16 1056    Visit Number 1   Number of Visits 8   Date for PT Re-Evaluation 01/10/17   Authorization Type MCD: Re-evaluate in 4 weeks   Authorization - Visit Number 1   Authorization - Number of Visits 4  evaluation + 3 treatment visits   PT Start Time 0930   PT Stop Time 1015   PT Time Calculation (min) 45 min   Activity Tolerance Patient tolerated treatment well   Behavior During Therapy [facility] for tasks assessed/performed      Past Medical History:  Diagnosis Date  . Abnormal Pap smear     Past Surgical History:  Procedure Laterality Date  . CESAREAN SECTION  06/01/2011   Procedure: CESAREAN SECTION;  Surgeon: Lavina Hamman, MD;  Location: [facility] ORS;  Service: Gynecology;  Laterality: N/A;  . dental implant     c section  . DILATION AND CURETTAGE OF UTERUS N/A 06/06/2015   Procedure: DILATATION AND CURETTAGE;  Surgeon: Edwinna Areola, DO;  Location: [facility] ORS;  Service: Gynecology;  Laterality: N/A;  with ultrasound guidence   . LEEP    . WISDOM TOOTH EXTRACTION      There were no vitals filed for this visit.       Subjective Assessment - 11/15/16 0938    Subjective pt is a 41 y.o F with CC of low back pain that started 1-2 weeks ago with non-traumatic onset. pt is currently 5 months pregnant. pt in the lower left back, denies N/T inthe legs.  since onset she reports she is doing better following laying down and taking medicaiton   Limitations Lifting   How long can you sit comfortably? 15 min   How long can you stand comfortably? 30 min   How long can you walk comfortably? 30 min   Diagnostic tests N/A    Patient Stated Goals strengthening exercise, know what to do when the pain sets in   Currently in Pain? Yes   Pain Score 1   at worst 7-8/10   Pain Location Back   Pain Orientation Left;Lower   Pain Descriptors / Indicators Aching;Dull;Sharp;Shooting   Pain Type Chronic pain   Pain Onset More than a month ago   Pain Frequency Intermittent   Aggravating Factors  certain movements, twisting, lifting, bending   Pain Relieving Factors medication, laying down, heating pad, ice, tylenol, stretching            [facility] PT Assessment - 11/15/16 0945      Assessment   Medical Diagnosis Low back pain   Referring Provider Huel Cote MD   Onset Date/Surgical Date --  1-2 weeks   Hand Dominance Right   Next MD Visit 11/16/2016   Prior Therapy no     Precautions   Precaution Comments currently 5 months pregnant     Restrictions   Weight Bearing Restrictions No     Balance Screen   Has the patient fallen in the past 6 months No   Has the patient had a decrease in activity level because of a fear of falling?  No   Is the patient reluctant to leave their home because of  a fear of falling?  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   Leisure going to the park, hiking, amusement parks, watching kids play sports  church     Cognition   Overall Cognitive Status Within Functional Limits for tasks assessed     ROM / Strength   AROM / PROM / Strength AROM;Strength     AROM   AROM Assessment Site Lumbar   Right/Left Hip Right;Left    Right/Left Knee Right;Left   Lumbar Flexion 30  stretching and mild soreness   Lumbar Extension 5   Lumbar - Right Side Bend 10   Lumbar - Left Side Bend 8     Strength   Strength Assessment Site Knee;Hip   Right/Left Hip Right;Left   Right Hip Flexion 4+/5   Right Hip Extension 4-/5   Right Hip ABduction 4+/5   Right Hip ADduction 4-/5   Left Hip Flexion 4/5   Left Hip Extension 4-/5   Left Hip ABduction 4/5   Left Hip ADduction 4-/5   Right/Left Knee Right;Left   Right Knee Flexion 4+/5   Right Knee Extension 4+/5   Left Knee Flexion 4+/5   Left Knee Extension 4+/5     Palpation   Palpation comment TTP along bil lumbar paraspinals and L PSIS, and at L4-L5              Objective measurements completed on examination: See above findings.          [facility] Adult PT Treatment/Exercise - 11/15/16 1157      Lumbar Exercises: Stretches   Lower Trunk Rotation --  1 x 10   Lower Trunk Rotation Limitations cues to stay within pain free ROM     Lumbar Exercises: Supine   Bent Knee Raise 10 reps  cues to go gently   Other Supine Lumbar Exercises adductor isometric 10 x 10 sec hold                 PT Education - 11/15/16 1056    Education provided Yes   Education Details evaluation findings, POC, goals, HEP with form/ rationale, anatomy of area involved.   Person(s) Educated Patient   Methods Explanation;Verbal cues;Handout   Comprehension Verbalized understanding;Verbal cues required          PT Short Term Goals - 11/15/16 1323      PT SHORT TERM GOAL #1   Title pt to be I with inital HEP    Baseline no previous HEP   Time 4   Period Weeks   Status New   Target Date 12/13/16     PT SHORT TERM GOAL #2   Title pt to verbalize and demo proper posture to prevent and reduce low back pain    Baseline no knowledge of proper posture   Time 4   Period Weeks   Status New   Target Date 12/13/16           PT Long Term Goals - 11/15/16 1324  PT LONG TERM GOAL #1   Title pt to increase trunk flexion to >/= 50 degrees and extension/ bil side bending to 15degrees with </= 1/10 pain for functional mobility required for ADLs    Baseline flexion 30 , extension 5, R sidebending10, L sidebending 8 with 3/10 pain   Time 8   Period Weeks   Status New   Target Date 01/10/17     PT LONG TERM GOAL #2   Title pt to report no pain in the low back for >/= 1 week to demo improvement of condition   Baseline back pain at 1/10 but has been constant   Time 8   Period Weeks   Status New   Target Date 01/10/17     PT LONG TERM GOAL #3   Title pt to be I with all HEP given as of last visit    Baseline no previous HEP   Time 8   Period Weeks   Status New   Target Date 01/10/17                Plan - 11/15/16 1057    Clinical Impression Statement pt is a 41 y.o F with CC of low back pain started 1-2 weeks ago with non-traumatic onset attributed to current pregnancy at 5 months. she demonstrates limited trunk mobility in all planes. TTP along bil lumbar paraspinals and tenderness at L4-L5 spinous process. Following adductor squeeze she reported decreased pain in the L low back. she would benefit from physical therapy to decrease low back pain, reduce muslce tightness and improve LE strength to assist with maintenance of good posture throughtout ADLs and maximize her function by addressing the deficits listed.    Clinical Presentation Stable   Clinical Decision Making Low   Rehab Potential Good   PT Frequency 1x / week   PT Duration 3 weeks  after the first 3 weeks. progressing to 1 x a week for 5 weeks   PT Treatment/Interventions ADLs/Self Care Home Management;Cryotherapy;Moist Heat;Therapeutic activities;Therapeutic exercise;Taping;Manual techniques;Passive range of motion;Patient/family education;Balance training;Neuromuscular re-education   PT Next Visit Plan review and update HEP PRN, (pt is 5 months pregnant). manual for low  back, adductor activation, hip/ knee strengthening, MHP post session   PT Home Exercise Plan supine marching, isometric adductor squeeze, lower trunk rotation, seated lumbar stretch, Cat /Cow   Consulted and Agree with Plan of Care Patient      Patient will benefit from skilled therapeutic intervention in order to improve the following deficits and impairments:  Abnormal gait, Pain, Improper body mechanics, Postural dysfunction, Decreased endurance, Decreased activity tolerance, Decreased strength, Decreased range of motion, Increased fascial restricitons  Visit Diagnosis: Acute bilateral low back pain without sciatica - Plan: PT plan of care cert/re-cert  Muscle weakness (generalized) - Plan: PT plan of care cert/re-cert  Muscle spasm of back - Plan: PT plan of care cert/re-cert     Problem List Patient Active Problem List   Diagnosis Date Noted  . Postpartum care following vaginal delivery 06/06/2015  . Fetal abnormality affecting management of mother 06/04/2015  . Advanced maternal age in multigravida 05/23/2015  . Abnormal finding on antenatal screen 05/23/2015  . Arrest of dilation, delivered, current hospitalization 06/01/2011  . Normal pregnancy, repeat 05/30/2011   Lulu Riding PT, DPT, LAT, ATC  11/15/16  1:34 PM      [facility] [address] [place], [place], [address] Phone: [phone]   Fax:  [phone]  Name:  Laura Carroll MRN: [id number] Date of Birth: 03-Sep-1975

## 2016-11-29 ENCOUNTER — Ambulatory Visit: Payer: Medicaid Other | Admitting: Physical Therapy

## 2016-12-06 ENCOUNTER — Ambulatory Visit: Payer: Medicaid Other | Attending: Obstetrics and Gynecology | Admitting: Physical Therapy

## 2016-12-06 ENCOUNTER — Encounter: Payer: Self-pay | Admitting: Physical Therapy

## 2016-12-06 DIAGNOSIS — M545 Low back pain, unspecified: Secondary | ICD-10-CM

## 2016-12-06 DIAGNOSIS — M62830 Muscle spasm of back: Secondary | ICD-10-CM | POA: Insufficient documentation

## 2016-12-06 DIAGNOSIS — M6281 Muscle weakness (generalized): Secondary | ICD-10-CM | POA: Diagnosis present

## 2016-12-06 NOTE — Therapy (Addendum)
[place], [place], [address] Phone: [phone]   Fax:  [phone]  Physical Therapy Treatment / Discharge Summary  Patient Details  Name: Laura Carroll MRN: [id number] Date of Birth: Jan 20, 1976 Referring Provider: Paula Compton MD   Encounter Date: 12/06/2016  PT End of Session - 12/06/16 0942    Visit Number  2    Number of Visits  8    Date for PT Re-Evaluation  01/10/17    PT Start Time  0930    PT Stop Time  1010    PT Time Calculation (min)  40 min    Activity Tolerance  Patient tolerated treatment well       Past Medical History:  Diagnosis Date  . Abnormal Pap smear     Past Surgical History:  Procedure Laterality Date  . dental implant     c section  . LEEP    . WISDOM TOOTH EXTRACTION      There were no vitals filed for this visit.  Subjective Assessment - 12/06/16 0926    Subjective  "The exercise are helping, I am doing them about every other day"  pt reports no pain today but occasional pain last week"     Currently in Pain?  No/denies    Pain Score  0-No pain    Pain Onset  More than a month ago    Aggravating Factors   twisting motion    Pain Relieving Factors  exercise.                       [place] Adult PT Treatment/Exercise - 12/06/16 0943      Lumbar Exercises: Stretches   Lower Trunk Rotation  -- 2 x 10 saying withing pain free ROM   2 x 10 saying withing pain free ROM   Pelvic Tilt  -- 2 x 10 combined with isometric ball squeeze   2 x 10 combined with isometric ball squeeze     Lumbar Exercises: Standing   Other Standing Lumbar Exercises  standing with back against wall with knees/ hips slightly flexed, pushing back into the wall 1 x 10 holding 5 sec      Lumbar Exercises: Seated   Sit to Stand  -- 1x 10, 1 x 6 (verbal cues to keep back in neutral position   1x 10, 1 x 6 (verbal cues to keep back in neutral position     Lumbar Exercises:  Supine   Other Supine Lumbar Exercises  adductor isometric 10 x 5 sec hold       Lumbar Exercises: Quadruped   Madcat/Old Horse  -- 1 x 10 holding position 3 sec ea.   1 x 10 holding position 3 sec ea.   Other Quadruped Lumbar Exercises  rocking forward/ back 2 x 10 holding 5 sec      Manual Therapy   Manual therapy comments  manual trigger point release over L Glute med x 3             PT Education - 12/06/16 1014    Education provided  Yes    Education Details  reviewed previously provided HEP, updated HEP. Education regarding LBP and pregnancy    Person(s) Educated  Patient    Methods  Explanation;Handout;Verbal cues    Comprehension  Verbal cues required;Verbalized understanding       PT Short Term Goals - 11/15/16 1323      PT  SHORT TERM GOAL #1   Title  pt to be I with inital HEP     Baseline  no previous HEP    Time  4    Period  Weeks    Status  New    Target Date  12/13/16      PT SHORT TERM GOAL #2   Title  pt to verbalize and demo proper posture to prevent and reduce low back pain     Baseline  no knowledge of proper posture    Time  4    Period  Weeks    Status  New    Target Date  12/13/16        PT Long Term Goals - 11/15/16 1324      PT LONG TERM GOAL #1   Title  pt to increase trunk flexion to >/= 50 degrees and extension/ bil side bending to 15degrees with </= 1/10 pain for functional mobility required for ADLs     Baseline  flexion 30 , extension 5, R sidebending10, L sidebending 8 with 3/10 pain    Time  8    Period  Weeks    Status  New    Target Date  01/10/17      PT LONG TERM GOAL #2   Title  pt to report no pain in the low back for >/= 1 week to demo improvement of condition    Baseline  back pain at 1/10 but has been constant    Time  8    Period  Weeks    Status  New    Target Date  01/10/17      PT LONG TERM GOAL #3   Title  pt to be I with all HEP given as of last visit     Baseline  no previous HEP    Time  8     Period  Weeks    Status  New    Target Date  01/10/17            Plan - 12/06/16 1014    Clinical Impression Statement  pt reports consistency with HEP and reports no pain today. Reviewed previously provided HEP and edcuated about low back and pregnancy. continued working on [employer] and keeping back in neutral position, pt performe exercises well and reproted no pain at end of session.     PT Treatment/Interventions  ADLs/Self Care Home Management;Cryotherapy;Moist Heat;Therapeutic activities;Therapeutic exercise;Taping;Manual techniques;Passive range of motion;Patient/family education;Balance training;Neuromuscular re-education    PT Next Visit Plan  update HEP, (pt is 5 months pregnant). manual for low back PRN, adductor activation, hip/ knee strengthening, MHP post session, If doing well discuss D/C    PT Home Exercise Plan  supine marching, isometric adductor squeeze, lower trunk rotation, seated lumbar stretch, Cat /Cow, quadruped rocking and sit to stand    Consulted and Agree with Plan of Care  Patient       Patient will benefit from skilled therapeutic intervention in order to improve the following deficits and impairments:  Abnormal gait, Pain, Improper body mechanics, Postural dysfunction, Decreased endurance, Decreased activity tolerance, Decreased strength, Decreased range of motion, Increased fascial restricitons  Visit Diagnosis: Acute bilateral low back pain without sciatica  Muscle weakness (generalized)  Muscle spasm of back     Problem List Patient Active Problem List   Diagnosis Date Noted  . Postpartum care following vaginal delivery 06/06/2015  . Fetal abnormality affecting management of mother 06/04/2015  . Advanced  maternal age in multigravida 05/23/2015  . Abnormal finding on antenatal screen 05/23/2015  . Arrest of dilation, delivered, current hospitalization 06/01/2011  . Normal pregnancy, repeat 05/30/2011   Starr Lake PT, DPT,  LAT, ATC  12/06/16  10:24 AM      [facility] [address] [place], [place], [address] Phone: [phone]   Fax:  [phone]  Name: Laura Carroll MRN: [id number] Date of Birth: 03-22-1975     PHYSICAL THERAPY DISCHARGE SUMMARY  Visits from Start of Care: 2  Current functional level related to goals / functional outcomes: See goals   Remaining deficits: Unknown   Education / Equipment: HEP, theraband  Plan: Patient agrees to discharge.  Patient goals were not met. Patient is being discharged due to not returning since the last visit.  ?????    Marleah Beever PT, DPT, LAT, ATC  01/11/17  3:56 PM

## 2016-12-06 NOTE — Patient Instructions (Addendum)

## 2016-12-13 ENCOUNTER — Ambulatory Visit: Payer: Medicaid Other | Admitting: Physical Therapy

## 2017-03-02 LAB — OB RESULTS CONSOLE GBS: STREP GROUP B AG: POSITIVE

## 2017-03-08 ENCOUNTER — Encounter (HOSPITAL_COMMUNITY): Payer: Self-pay | Admitting: *Deleted

## 2017-03-22 ENCOUNTER — Encounter (HOSPITAL_COMMUNITY)
Admission: RE | Admit: 2017-03-22 | Discharge: 2017-03-22 | Disposition: A | Discharge status: Home / Self Care | Payer: Medicaid Other | Source: Ambulatory Visit | Attending: Obstetrics and Gynecology | Admitting: Obstetrics and Gynecology

## 2017-03-22 HISTORY — DX: Supervision of elderly multigravida, unspecified trimester: O09.529

## 2017-03-22 HISTORY — DX: Unspecified abnormal cytological findings in specimens from vagina: R87.629

## 2017-03-22 LAB — CBC
HCT: 34.1 % — ABNORMAL LOW (ref 36.0–46.0)
HEMOGLOBIN: 11.2 g/dL — AB (ref 12.0–15.0)
MCH: 28.4 pg (ref 26.0–34.0)
MCHC: 32.8 g/dL (ref 30.0–36.0)
MCV: 86.5 fL (ref 78.0–100.0)
Platelets: 216 10*3/uL (ref 150–400)
RBC: 3.94 MIL/uL (ref 3.87–5.11)
RDW: 17.2 % — ABNORMAL HIGH (ref 11.5–15.5)
WBC: 9.3 10*3/uL (ref 4.0–10.5)

## 2017-03-22 LAB — TYPE AND SCREEN
ABO/RH(D): O POS
ANTIBODY SCREEN: NEGATIVE

## 2017-03-22 NOTE — Patient Instructions (Signed)
Tobin ChadJennifer M Richert  03/22/2017   Your procedure is scheduled on:  03/23/2017  Enter through the Main Entrance of [facility] at 0630 AM.  Pick up the phone at the desk and dial [phone]  Call this number if you have problems the morning of [phone]  Remember:   Do not eat food:(After Midnight) Desps de medianoche.  Do not drink clear liquids: (After Midnight) Desps de medianoche.  Take these medicines the morning of surgery with A SIP OF WATER: none   Do not wear jewelry, make-up or nail polish.  Do not wear lotions, powders, or perfumes. Do not wear deodorant.  Do not shave 48 hours prior to surgery.  Do not bring valuables to the hospital.  [facility] is not   responsible for any belongings or valuables brought to the hospital.  Contacts, dentures or bridgework may not be worn into surgery.  Leave suitcase in the car. After surgery it may be brought to your room.  For patients admitted to the hospital, checkout time is 11:00 AM the day of              discharge.    N/A   Please read over the following fact sheets that you were given:   Surgical Site Infection Prevention

## 2017-03-22 NOTE — H&P (Signed)
Laura Carroll is a 42 y.o. female, G7 [id number], EGA [redacted] weeks with EDC 2-26 presenting for for repeat c-section.  She terminated a pregnancy in 2016 with Trisomy 21, this pregnancy Panorama low risk.  2 previous SVD, then LTCS, she desires repeat c-section.  Pregnancy essentially uncomplicated.  OB History    Gravida Para Term Preterm AB Living   6 4 3  0 1 3   SAB TAB Ectopic Multiple Live Births   1 0 0 0 3     Past Medical History:  Diagnosis Date  . Abnormal Pap smear   . AMA (advanced maternal age) multigravida 35+   . Vaginal Pap smear, abnormal    Past Surgical History:  Procedure Laterality Date  . CESAREAN SECTION  06/01/2011   Procedure: CESAREAN SECTION;  Surgeon: Lavina Hammanodd Amela Handley, MD;  Location: [facility];  Service: Gynecology;  Laterality: N/A;  . dental implant     c section  . DILATION AND CURETTAGE OF UTERUS N/A 06/06/2015   Procedure: DILATATION AND CURETTAGE;  Surgeon: Edwinna Areolaecilia Worema Banga, DO;  Location: [facility];  Service: Gynecology;  Laterality: N/A;  with ultrasound guidence   . LEEP    . WISDOM TOOTH EXTRACTION     Family History: family history includes Alcohol abuse in her mother; Anxiety disorder in her mother; COPD in her maternal grandmother; Cancer in her maternal aunt and maternal grandfather; Mental illness in her mother. Social History:  reports that she has quit smoking. she has never used smokeless tobacco. She reports that she does not drink alcohol or use drugs.     Maternal Diabetes: No Genetic Screening: Normal Maternal Ultrasounds/Referrals: Normal Fetal Ultrasounds or other Referrals:  None Maternal Substance Abuse:  No Significant Maternal Medications:  None Significant Maternal Lab Results:  Lab values include: Group B Strep positive Other Comments:  None  Review of Systems  Respiratory: Negative.   Cardiovascular: Negative.    Maternal Medical History:  Fetal activity: Perceived fetal activity is normal.    Prenatal complications: no  prenatal complications Prenatal Complications - Diabetes: none.      Last menstrual period 06/22/2016, unknown if currently breastfeeding. Maternal Exam:  Uterine Assessment: Contraction strength is mild.  Contraction frequency is irregular.   Abdomen: Patient reports no abdominal tenderness. Surgical scars: low transverse.   Estimated fetal weight is 7 lbs.   Fetal presentation: vertex  Introitus: Normal vulva. Normal vagina.  Amniotic fluid character: not assessed.  Pelvis: adequate for delivery.   Cervix: Cervix evaluated by digital exam.     Physical Exam  Constitutional: She appears well-developed and well-nourished.  Neck: Normal range of motion. Neck supple. No thyromegaly present.  Cardiovascular: Normal rate, regular rhythm and normal heart sounds.  No murmur heard. Respiratory: Effort normal and breath sounds normal. No respiratory distress. She has no wheezes.  GI: Soft.    Prenatal labs: ABO, Rh: O/Positive/-- (08/21 0000) Antibody: Negative (08/21 0000) Rubella: Immune (08/21 0000) RPR: Nonreactive (08/21 0000)  HBsAg: Negative (08/21 0000)  HIV: Non-reactive (08/21 0000)  GBS: Positive (01/30 0000)   Assessment/Plan: IUP at 39 weeks, previous c-section, declines VBAC.  C-section procedure and risks have been discussed, will admit for repeat c-section.   Leighton Roachodd D Joas Motton 03/22/2017, 8:36 AM

## 2017-03-22 NOTE — Anesthesia Preprocedure Evaluation (Addendum)
Anesthesia Evaluation  Patient identified by MRN, date of birth, ID band Patient awake    Reviewed: Allergy & Precautions, NPO status , Patient's Chart, lab work & pertinent test results  Airway Mallampati: II  TM Distance: >3 FB Neck ROM: Full    Dental no notable dental hx.    Pulmonary former smoker,    Pulmonary exam normal breath sounds clear to auscultation       Cardiovascular negative cardio ROS Normal cardiovascular exam Rhythm:Regular Rate:Normal     Neuro/Psych negative neurological ROS  negative psych ROS   GI/Hepatic negative GI ROS, Neg liver ROS,   Endo/Other  negative endocrine ROS  Renal/GU negative Renal ROS     Musculoskeletal negative musculoskeletal ROS (+)   Abdominal   Peds  Hematology  (+) anemia ,   Anesthesia Other Findings repeat c-section   Reproductive/Obstetrics (+) Pregnancy                            Anesthesia Physical Anesthesia Plan  ASA: II  Anesthesia Plan: Spinal   Post-op Pain Management:    Induction:   PONV Risk Score and Plan: 2 and Treatment may vary due to age or medical condition, Ondansetron and Scopolamine patch - Pre-op  Airway Management Planned: Natural Airway  Additional Equipment:   Intra-op Plan:   Post-operative Plan:   Informed Consent: I have reviewed the patients History and Physical, chart, labs and discussed the procedure including the risks, benefits and alternatives for the proposed anesthesia with the patient or authorized representative who has indicated his/her understanding and acceptance.   Dental advisory given  Plan Discussed with: CRNA  Anesthesia Plan Comments:         Anesthesia Quick Evaluation

## 2017-03-23 ENCOUNTER — Inpatient Hospital Stay (HOSPITAL_COMMUNITY): Payer: Medicaid Other | Admitting: Anesthesiology

## 2017-03-23 ENCOUNTER — Encounter (HOSPITAL_COMMUNITY): Payer: Self-pay

## 2017-03-23 ENCOUNTER — Inpatient Hospital Stay (HOSPITAL_COMMUNITY)
Admission: AD | Admit: 2017-03-23 | Discharge: 2017-03-25 | DRG: 788 | Disposition: A | Discharge status: Home / Self Care | Payer: Medicaid Other | Source: Ambulatory Visit | Attending: Obstetrics and Gynecology | Admitting: Obstetrics and Gynecology

## 2017-03-23 ENCOUNTER — Encounter (HOSPITAL_COMMUNITY)
Admission: AD | Disposition: A | Discharge status: Home / Self Care | Payer: Self-pay | Source: Ambulatory Visit | Attending: Obstetrics and Gynecology

## 2017-03-23 DIAGNOSIS — O34211 Maternal care for low transverse scar from previous cesarean delivery: Principal | ICD-10-CM | POA: Diagnosis present

## 2017-03-23 DIAGNOSIS — Z3A39 39 weeks gestation of pregnancy: Secondary | ICD-10-CM | POA: Diagnosis not present

## 2017-03-23 DIAGNOSIS — Z98891 History of uterine scar from previous surgery: Secondary | ICD-10-CM

## 2017-03-23 DIAGNOSIS — Z87891 Personal history of nicotine dependence: Secondary | ICD-10-CM

## 2017-03-23 DIAGNOSIS — O99824 Streptococcus B carrier state complicating childbirth: Secondary | ICD-10-CM | POA: Diagnosis present

## 2017-03-23 DIAGNOSIS — O9902 Anemia complicating childbirth: Secondary | ICD-10-CM | POA: Diagnosis present

## 2017-03-23 DIAGNOSIS — D649 Anemia, unspecified: Secondary | ICD-10-CM | POA: Diagnosis present

## 2017-03-23 LAB — RPR: RPR Ser Ql: NONREACTIVE

## 2017-03-23 SURGERY — Surgical Case
Anesthesia: Spinal

## 2017-03-23 MED ORDER — BUPIVACAINE IN DEXTROSE 0.75-8.25 % IT SOLN
INTRATHECAL | Status: DC | PRN
  Administered 2017-03-23: 1.5 mL via INTRATHECAL

## 2017-03-23 MED ORDER — PRENATAL MULTIVITAMIN CH
1.0000 | ORAL_TABLET | Freq: Every day | ORAL | Status: DC
  Administered 2017-03-23 – 2017-03-25 (×3): 1 via ORAL
  Filled 2017-03-23 (×3): qty 1

## 2017-03-23 MED ORDER — SIMETHICONE 80 MG PO CHEW
80.0000 mg | CHEWABLE_TABLET | ORAL | Status: DC | PRN
  Administered 2017-03-24 (×2): 80 mg via ORAL
  Filled 2017-03-23 (×2): qty 1

## 2017-03-23 MED ORDER — ZOLPIDEM TARTRATE 5 MG PO TABS: 5.0000 mg | ORAL_TABLET | Freq: Every evening | ORAL | Status: DC | PRN

## 2017-03-23 MED ORDER — PHENYLEPHRINE 8 MG IN D5W 100 ML (0.08MG/ML) PREMIX OPTIME
INJECTION | INTRAVENOUS | Status: AC
  Filled 2017-03-23: qty 100

## 2017-03-23 MED ORDER — PROMETHAZINE HCL 25 MG/ML IJ SOLN: 6.2500 mg | INTRAMUSCULAR | Status: DC | PRN

## 2017-03-23 MED ORDER — FAMOTIDINE 20 MG PO TABS
20.0000 mg | ORAL_TABLET | Freq: Once | ORAL | Status: AC
  Administered 2017-03-23: 20 mg via ORAL
  Filled 2017-03-23: qty 1

## 2017-03-23 MED ORDER — CEFAZOLIN SODIUM-DEXTROSE 2-4 GM/100ML-% IV SOLN
2.0000 g | INTRAVENOUS | Status: AC
  Administered 2017-03-23: 2 g via INTRAVENOUS
  Filled 2017-03-23: qty 100

## 2017-03-23 MED ORDER — SCOPOLAMINE 1 MG/3DAYS TD PT72
MEDICATED_PATCH | TRANSDERMAL | Status: AC
  Filled 2017-03-23: qty 1

## 2017-03-23 MED ORDER — LACTATED RINGERS IV SOLN
INTRAVENOUS | Status: DC | PRN
  Administered 2017-03-23: 09:00:00 via INTRAVENOUS

## 2017-03-23 MED ORDER — OXYTOCIN 10 UNIT/ML IJ SOLN
INTRAVENOUS | Status: DC | PRN
  Administered 2017-03-23: 40 [IU] via INTRAVENOUS

## 2017-03-23 MED ORDER — MORPHINE SULFATE (PF) 0.5 MG/ML IJ SOLN
INTRAMUSCULAR | Status: DC | PRN
Start: 2017-03-23 — End: 2017-03-23
  Administered 2017-03-23: .2 mg via INTRATHECAL

## 2017-03-23 MED ORDER — MORPHINE SULFATE (PF) 0.5 MG/ML IJ SOLN
INTRAMUSCULAR | Status: AC
  Filled 2017-03-23: qty 10

## 2017-03-23 MED ORDER — KETOROLAC TROMETHAMINE 30 MG/ML IJ SOLN: 30.0000 mg | Freq: Once | INTRAMUSCULAR | Status: DC | PRN

## 2017-03-23 MED ORDER — FENTANYL CITRATE (PF) 100 MCG/2ML IJ SOLN
INTRAMUSCULAR | Status: DC | PRN
  Administered 2017-03-23: 10 ug via INTRATHECAL

## 2017-03-23 MED ORDER — COCONUT OIL OIL
1.0000 "application " | TOPICAL_OIL | Status: DC | PRN
  Administered 2017-03-24: 1 via TOPICAL
  Filled 2017-03-23: qty 120

## 2017-03-23 MED ORDER — SENNOSIDES-DOCUSATE SODIUM 8.6-50 MG PO TABS
2.0000 | ORAL_TABLET | ORAL | Status: DC
  Administered 2017-03-24 (×2): 2 via ORAL
  Filled 2017-03-23 (×2): qty 2

## 2017-03-23 MED ORDER — ONDANSETRON HCL 4 MG/2ML IJ SOLN
INTRAMUSCULAR | Status: AC
  Filled 2017-03-23: qty 2

## 2017-03-23 MED ORDER — MENTHOL 3 MG MT LOZG: 1.0000 | LOZENGE | OROMUCOSAL | Status: DC | PRN

## 2017-03-23 MED ORDER — OXYTOCIN 10 UNIT/ML IJ SOLN
INTRAMUSCULAR | Status: AC
  Filled 2017-03-23: qty 4

## 2017-03-23 MED ORDER — OXYCODONE HCL 5 MG PO TABS: 5.0000 mg | ORAL_TABLET | ORAL | Status: DC | PRN

## 2017-03-23 MED ORDER — ONDANSETRON HCL 4 MG/2ML IJ SOLN
INTRAMUSCULAR | Status: DC | PRN
  Administered 2017-03-23: 4 mg via INTRAVENOUS

## 2017-03-23 MED ORDER — LACTATED RINGERS IV SOLN
INTRAVENOUS | Status: DC
  Administered 2017-03-23 (×2): via INTRAVENOUS

## 2017-03-23 MED ORDER — TETANUS-DIPHTH-ACELL PERTUSSIS 5-2.5-18.5 LF-MCG/0.5 IM SUSP: 0.5000 mL | Freq: Once | INTRAMUSCULAR | Status: DC

## 2017-03-23 MED ORDER — OXYCODONE HCL 5 MG/5ML PO SOLN: 5.0000 mg | Freq: Once | ORAL | Status: DC | PRN

## 2017-03-23 MED ORDER — DIBUCAINE 1 % RE OINT: 1.0000 "application " | TOPICAL_OINTMENT | RECTAL | Status: DC | PRN

## 2017-03-23 MED ORDER — PHENYLEPHRINE 8 MG IN D5W 100 ML (0.08MG/ML) PREMIX OPTIME
INJECTION | INTRAVENOUS | Status: DC | PRN
  Administered 2017-03-23: 60 ug/min via INTRAVENOUS

## 2017-03-23 MED ORDER — SODIUM CHLORIDE 0.9 % IR SOLN
Status: DC | PRN
  Administered 2017-03-23: 1

## 2017-03-23 MED ORDER — HYDROMORPHONE HCL 1 MG/ML IJ SOLN: 0.2500 mg | INTRAMUSCULAR | Status: DC | PRN

## 2017-03-23 MED ORDER — LACTATED RINGERS IV SOLN
INTRAVENOUS | Status: DC
  Administered 2017-03-23 (×2): 125 mL/h via INTRAVENOUS

## 2017-03-23 MED ORDER — IBUPROFEN 600 MG PO TABS
600.0000 mg | ORAL_TABLET | Freq: Four times a day (QID) | ORAL | Status: DC
  Administered 2017-03-23 – 2017-03-25 (×9): 600 mg via ORAL
  Filled 2017-03-23 (×9): qty 1

## 2017-03-23 MED ORDER — OXYTOCIN 40 UNITS IN LACTATED RINGERS INFUSION - SIMPLE MED: 2.5000 [IU]/h | INTRAVENOUS | Status: AC

## 2017-03-23 MED ORDER — WITCH HAZEL-GLYCERIN EX PADS: 1.0000 "application " | MEDICATED_PAD | CUTANEOUS | Status: DC | PRN

## 2017-03-23 MED ORDER — FENTANYL CITRATE (PF) 100 MCG/2ML IJ SOLN
INTRAMUSCULAR | Status: AC
  Filled 2017-03-23: qty 2

## 2017-03-23 MED ORDER — OXYCODONE HCL 5 MG PO TABS: 10.0000 mg | ORAL_TABLET | ORAL | Status: DC | PRN

## 2017-03-23 MED ORDER — DIPHENHYDRAMINE HCL 25 MG PO CAPS: 25.0000 mg | ORAL_CAPSULE | Freq: Four times a day (QID) | ORAL | Status: DC | PRN

## 2017-03-23 MED ORDER — MEPERIDINE HCL 25 MG/ML IJ SOLN: 6.2500 mg | INTRAMUSCULAR | Status: DC | PRN

## 2017-03-23 MED ORDER — ACETAMINOPHEN 325 MG PO TABS
650.0000 mg | ORAL_TABLET | ORAL | Status: DC | PRN
  Administered 2017-03-25: 650 mg via ORAL
  Filled 2017-03-23 (×2): qty 2

## 2017-03-23 MED ORDER — OXYCODONE HCL 5 MG PO TABS: 5.0000 mg | ORAL_TABLET | Freq: Once | ORAL | Status: DC | PRN

## 2017-03-23 MED ORDER — MAGNESIUM HYDROXIDE 400 MG/5ML PO SUSP: 30.0000 mL | ORAL | Status: DC | PRN

## 2017-03-23 MED ORDER — MEASLES, MUMPS & RUBELLA VAC ~~LOC~~ INJ
0.5000 mL | INJECTION | Freq: Once | SUBCUTANEOUS | Status: DC
  Filled 2017-03-23: qty 0.5

## 2017-03-23 MED ORDER — SCOPOLAMINE 1 MG/3DAYS TD PT72
1.0000 | MEDICATED_PATCH | Freq: Once | TRANSDERMAL | Status: DC
  Administered 2017-03-23: 1.5 mg via TRANSDERMAL
  Filled 2017-03-23: qty 1

## 2017-03-23 SURGICAL SUPPLY — 35 items
APL SKNCLS STERI-STRIP NONHPOA (GAUZE/BANDAGES/DRESSINGS) ×1
BENZOIN TINCTURE PRP APPL 2/3 (GAUZE/BANDAGES/DRESSINGS) ×3 IMPLANT
CHLORAPREP W/TINT 26ML (MISCELLANEOUS) ×3 IMPLANT
CLAMP CORD UMBIL (MISCELLANEOUS) IMPLANT
CLOSURE WOUND 1/2 X4 (GAUZE/BANDAGES/DRESSINGS) ×1
CLOTH BEACON ORANGE TIMEOUT ST (SAFETY) ×3 IMPLANT
DRSG OPSITE POSTOP 4X10 (GAUZE/BANDAGES/DRESSINGS) ×3 IMPLANT
ELECT REM PT RETURN 9FT ADLT (ELECTROSURGICAL) ×3
ELECTRODE REM PT RTRN 9FT ADLT (ELECTROSURGICAL) ×1 IMPLANT
EXTRACTOR VACUUM KIWI (MISCELLANEOUS) IMPLANT
EXTRACTOR VACUUM M CUP 4 TUBE (SUCTIONS) IMPLANT
EXTRACTOR VACUUM M CUP 4' TUBE (SUCTIONS)
GLOVE BIOGEL PI IND STRL 7.0 (GLOVE) ×1 IMPLANT
GLOVE BIOGEL PI INDICATOR 7.0 (GLOVE) ×2
GLOVE ORTHO TXT STRL SZ7.5 (GLOVE) ×3 IMPLANT
GOWN STRL REUS W/TWL LRG LVL3 (GOWN DISPOSABLE) ×6 IMPLANT
KIT ABG SYR 3ML LUER SLIP (SYRINGE) IMPLANT
NEEDLE HYPO 25X5/8 SAFETYGLIDE (NEEDLE) ×3 IMPLANT
NS IRRIG 1000ML POUR BTL (IV SOLUTION) ×3 IMPLANT
PACK C SECTION WH (CUSTOM PROCEDURE TRAY) ×3 IMPLANT
PAD OB MATERNITY 4.3X12.25 (PERSONAL CARE ITEMS) ×3 IMPLANT
PENCIL SMOKE EVAC W/HOLSTER (ELECTROSURGICAL) ×3 IMPLANT
RTRCTR C-SECT PINK 25CM LRG (MISCELLANEOUS) ×3 IMPLANT
STRIP CLOSURE SKIN 1/2X4 (GAUZE/BANDAGES/DRESSINGS) ×2 IMPLANT
SUT CHROMIC 1 CTX 36 (SUTURE) ×6 IMPLANT
SUT PLAIN 0 NONE (SUTURE) IMPLANT
SUT PLAIN 2 0 XLH (SUTURE) IMPLANT
SUT VIC AB 0 CT1 27 (SUTURE) ×6
SUT VIC AB 0 CT1 27XBRD ANBCTR (SUTURE) ×2 IMPLANT
SUT VIC AB 2-0 CT1 (SUTURE) ×3 IMPLANT
SUT VIC AB 2-0 CT1 27 (SUTURE) ×2
SUT VIC AB 2-0 CT1 TAPERPNT 27 (SUTURE) ×1 IMPLANT
SUT VIC AB 4-0 KS 27 (SUTURE) IMPLANT
TOWEL OR 17X24 6PK STRL BLUE (TOWEL DISPOSABLE) ×3 IMPLANT
TRAY FOLEY BAG SILVER LF 14FR (SET/KITS/TRAYS/PACK) ×3 IMPLANT

## 2017-03-23 NOTE — Anesthesia Postprocedure Evaluation (Signed)
Anesthesia Post Note  Patient: Laura Carroll  Procedure(s) Performed: REPEAT CESAREAN SECTION (N/A )     Patient location during evaluation: Mother Baby Anesthesia Type: Spinal Level of consciousness: awake and alert and oriented Pain management: satisfactory to patient Vital Signs Assessment: post-procedure vital signs reviewed and stable Respiratory status: respiratory function stable and spontaneous breathing Cardiovascular status: blood pressure returned to baseline Postop Assessment: no headache, no backache, spinal receding, patient able to bend at knees and adequate PO intake Anesthetic complications: no    Last Vitals:  Vitals:   03/23/17 1500 03/23/17 1628  BP:    Pulse:    Resp:  18  Temp:  37.3 C  SpO2: 97% 96%    Last Pain:  Vitals:   03/23/17 1628  TempSrc: Oral  PainSc:    Pain Goal:                 [name]

## 2017-03-23 NOTE — Transfer of Care (Signed)
Immediate Anesthesia Transfer of Care Note  Patient: Laura Carroll  Procedure(s) Performed: REPEAT CESAREAN SECTION (N/A )  Patient Location: PACU  Anesthesia Type:Spinal  Level of Consciousness: awake, alert  and oriented  Airway & Oxygen Therapy: Patient Spontanous Breathing  Post-op Assessment: Report given to RN and Post -op Vital signs reviewed and stable  Post vital signs: Reviewed and stable  Last Vitals:  Vitals:   03/23/17 0641 03/23/17 0705  BP: 125/71   Pulse: 76   Resp:  20  Temp: 36.6 C     Last Pain:  Vitals:   03/23/17 0641  TempSrc: Oral         Complications: No apparent anesthesia complications

## 2017-03-23 NOTE — Op Note (Signed)
Preoperative diagnosis: Intrauterine pregnancy at 39 weeks, previous c-section Postoperative diagnosis: Same Procedure: Repeat low transverse cesarean section without extensions Surgeon: Lavina Hammanodd Becci Batty M.D. Assistant:  Darlina SicilianHeather Kreitmeyer, RNFA Anesthesia: Spinal  Findings: Patient had normal gravid anatomy and delivered a viable female infant with Apgars of 9 and 10 weight pending Estimated blood loss: 850 cc Specimens: Placenta sent to labor and delivery Complications: None  Procedure in detail: The patient was taken to the operating room and placed in the sitting position. The anesthesiologist instilled spinal anesthesia.  She was then placed in the dorsosupine position with left tilt. Abdomen was then prepped and draped in the usual sterile fashion, and a foley catheter was inserted. The level of her anesthesia was found to be adequate. Abdomen was entered via a standard Pfannenstiel incision through her previous scar. Once the peritoneal cavity was entered the Alexis disposable self-retaining retractor was placed and good visualization was achieved. Bladder flap adhesions were taken down sharply.  A 4 cm transverse incision was then made in the lower uterine segment pushing the bladder inferior. Once the uterine cavity was entered the incision was extended digitally. The fetal vertex was grasped and delivered through the incision atraumatically. Mouth and nares were suctioned. The remainder of the infant then delivered atraumatically. Cord was doubly clamped and cut and the infant handed to the awaiting pediatric team. Cord blood was obtained. The placenta delivered spontaneously. Uterus was wiped dry with clean lap pad and all clots and debris were removed. Uterine incision was inspected and found to be free of extensions. Uterine incision was closed in 1 layer with running locking #1 Chromic.  Figure 8 sutures of #1 Chromic used at both angles for hemostasis. Tubes and ovaries were inspected and  found to be normal. Uterine incision was inspected and found to be hemostatic. Bleeding from serosal edges was controlled with electrocautery. The Alexis retractor was removed. Subfascial space was irrigated and made hemostatic with electrocautery. Peritoneum was closed with 2-0 Vicryl.  Fascia was closed in running fashion starting at both ends and meeting in the middle with 0 Vicryl. Subcutaneous tissue was then irrigated and made hemostatic with electrocautery, then closed with running 2-0 plain gut. Skin was closed with running 4-0 Vicryl subcuticular suture followed by steri-strips and a sterile dressing. Patient tolerated the procedure well and was taken to the recovery in stable condition. Counts were correct x2, she received Ancef 2 g IV at the beginning of the procedure and she had PAS hose on throughout the procedure.

## 2017-03-23 NOTE — Interval H&P Note (Signed)
History and Physical Interval Note:  03/23/2017 8:07 AM  Laura ChadJennifer M Carroll  has presented today for surgery, with the diagnosis of repeat c-section   The various methods of treatment have been discussed with the patient and family. After consideration of risks, benefits and other options for treatment, the patient has consented to  Procedure(s) with comments: REPEAT CESAREAN SECTION (N/A) - Heather RNFA as a surgical intervention .  The patient's history has been reviewed, patient examined, no change in status, stable for surgery.  I have reviewed the patient's chart and labs.  Questions were answered to the patient's satisfaction.     Leighton Roachodd D Eula Mazzola

## 2017-03-23 NOTE — Anesthesia Procedure Notes (Signed)
Spinal  Patient location during procedure: OR Start time: 03/23/2017 8:25 AM End time: 03/23/2017 8:30 AM Staffing Anesthesiologist: Leonides GrillsEllender, Ryan P, MD Performed: anesthesiologist  Preanesthetic Checklist Completed: patient identified, surgical consent, pre-op evaluation, timeout performed, IV checked, risks and benefits discussed and monitors and equipment checked Spinal Block Patient position: sitting Prep: DuraPrep Patient monitoring: cardiac monitor, continuous pulse ox and blood pressure Approach: midline Location: L3-4 Injection technique: single-shot Needle Needle type: Pencan  Needle gauge: 24 G Needle length: 9 cm Assessment Sensory level: T10 Additional Notes Functioning IV was confirmed and monitors were applied. Sterile prep and drape, including hand hygiene and sterile gloves were used. The patient was positioned and the spine was prepped. The skin was anesthetized with lidocaine.  Free flow of clear CSF was obtained prior to injecting local anesthetic into the CSF.  The spinal needle aspirated freely following injection.  The needle was carefully withdrawn.  The patient tolerated the procedure well.

## 2017-03-24 ENCOUNTER — Encounter (HOSPITAL_COMMUNITY): Payer: Self-pay | Admitting: Obstetrics and Gynecology

## 2017-03-24 ENCOUNTER — Other Ambulatory Visit: Payer: Self-pay

## 2017-03-24 LAB — CBC
HEMATOCRIT: 26.9 % — AB (ref 36.0–46.0)
HEMOGLOBIN: 8.8 g/dL — AB (ref 12.0–15.0)
MCH: 28.6 pg (ref 26.0–34.0)
MCHC: 32.7 g/dL (ref 30.0–36.0)
MCV: 87.3 fL (ref 78.0–100.0)
Platelets: 206 10*3/uL (ref 150–400)
RBC: 3.08 MIL/uL — ABNORMAL LOW (ref 3.87–5.11)
RDW: 17.4 % — AB (ref 11.5–15.5)
WBC: 10.6 10*3/uL — ABNORMAL HIGH (ref 4.0–10.5)

## 2017-03-24 LAB — BIRTH TISSUE RECOVERY COLLECTION (PLACENTA DONATION)

## 2017-03-24 NOTE — Progress Notes (Signed)
Subjective: Postpartum Day 1: Cesarean Delivery Patient reports incisional pain and tolerating PO.  Nl lochia, pain controlled  Objective: Vital signs in last 24 hours: Temp:  [97.5 F (36.4 C)-99.1 F (37.3 C)] 98.4 F (36.9 C) (02/21 0338) Pulse Rate:  [58-75] 75 (02/21 0338) Resp:  [10-20] 16 (02/21 0338) BP: (102-120)/(41-75) 106/48 (02/21 0338) SpO2:  [96 %-100 %] 98 % (02/21 0338)  Physical Exam:  General: alert and no distress Lochia: appropriate Uterine Fundus: firm Incision: healing well DVT Evaluation: No evidence of DVT seen on physical exam.  Recent Labs    03/22/17 1130 03/24/17 0511  HGB 11.2* 8.8*  HCT 34.1* 26.9*    Assessment/Plan: Status post Cesarean section. Doing well postoperatively.  Continue current care.  Routine PP care  Jaison Petraglia Bovard-Stuckert 03/24/2017, 7:34 AM

## 2017-03-24 NOTE — Lactation Note (Signed)
This note was copied from a baby's chart. Lactation Consultation Note  Patient Name: Laura Carroll NFAOZ'HToday's Date: 03/24/2017 Reason for consult: Initial assessment;Term Breastfeeding consultation services and support information given and reviewed.  This is mom's 4th baby and previous babies breastfed well.  Mom reports newborn has difficulty with latch and it takes several attempts.  Baby is currently on breast suckling on and off.  Mom has also syringe fed colostrum to baby.  Reviewed suck training on finger prior to latch if difficult.  Encouraged to call for assist/concerns prn  Maternal Data Has patient been taught Hand Expression?: Yes Does the patient have breastfeeding experience prior to this delivery?: Yes  Feeding Feeding Type: Breast Fed  LATCH Score Latch: Repeated attempts needed to sustain latch, nipple held in mouth throughout feeding, stimulation needed to elicit sucking reflex.  Audible Swallowing: A few with stimulation  Type of Nipple: Everted at rest and after stimulation  Comfort (Breast/Nipple): Soft / non-tender  Hold (Positioning): No assistance needed to correctly position infant at breast.  LATCH Score: 8  Interventions    Lactation Tools Discussed/Used     Consult Status Consult Status: Follow-up Date: 03/25/17 Follow-up type: In-patient    Huston FoleyMOULDEN, [name] 03/24/2017, 8:50 AM

## 2017-03-25 MED ORDER — OXYCODONE HCL 5 MG PO TABS: 5.0000 mg | ORAL_TABLET | ORAL | 0 refills | Status: DC | PRN

## 2017-03-25 MED ORDER — IBUPROFEN 600 MG PO TABS: 600.0000 mg | ORAL_TABLET | Freq: Four times a day (QID) | ORAL | 0 refills | Status: AC

## 2017-03-25 NOTE — Progress Notes (Signed)
Subjective: Postpartum Day 2  Cesarean Delivery Patient reports tolerating PO and no problems voiding.  Pain controlled and ambulating.  Pt reports frequent spitting by baby  Objective: Vital signs in last 24 hours: Temp:  [98.4 F (36.9 C)-98.6 F (37 C)] 98.6 F (37 C) (02/22 0521) Pulse Rate:  [72-82] 72 (02/22 0521) Resp:  [18] 18 (02/22 0521) BP: (119-123)/(51-60) 119/60 (02/22 0521)  Physical Exam:  General: alert and cooperative Lochia: appropriate Uterine Fundus: firm Incision: C/D/I   Recent Labs    03/22/17 1130 03/24/17 0511  HGB 11.2* 8.8*  HCT 34.1* 26.9*    Assessment/Plan: Status post Cesarean section. Doing well postoperatively.  Continue current care. D/w pt circumcision and desires to proceed.  Baby has not eaten in 3 hours so will proceed.  To discuss frequent emesis with pediatrician and maybe stay one more day.  Oliver PilaKathy W Zuriel Roskos 03/25/2017, 9:47 AM

## 2017-03-25 NOTE — Discharge Summary (Signed)
OB Discharge Summary     Patient Name: Laura Carroll DOB: Aug 08, 1975 MRN: [id number]  Date of admission: 03/23/2017 Delivering MD: Jackelyn Knife, TODD   Date of discharge: 03/25/2017  Admitting diagnosis: repeat c-section  Intrauterine pregnancy: [redacted]w[redacted]d     Secondary diagnosis:  Active Problems:   S/P cesarean section  Additional problems: none     Discharge diagnosis: Term Pregnancy Delivered                                                                                                Post partum procedures:none  Complications: None  Hospital course:  Sceduled C/S   42 y.o. yo [id number] at [redacted]w[redacted]d was admitted to the hospital 03/23/2017 for scheduled cesarean section with the following indication:Elective Repeat.  Membrane Rupture Time/Date: 8:53 AM ,03/23/2017   Patient delivered a Viable infant.03/23/2017  Details of operation can be found in separate operative note.  Pateint had an uncomplicated postpartum course.  She is ambulating, tolerating a regular diet, passing flatus, and urinating well. Patient is discharged home in stable condition on  03/25/17         Physical exam  Vitals:   03/24/17 0338 03/24/17 0830 03/24/17 1815 03/25/17 0521  BP: (!) 106/48 101/65 (!) 123/51 119/60  Pulse: 75 78 82 72  Resp: 16 18 18 18   Temp: 98.4 F (36.9 C) 99.3 F (37.4 C) 98.4 F (36.9 C) 98.6 F (37 C)  TempSrc: Oral Axillary Oral Oral  SpO2: 98%     Weight:      Height:       General: alert and cooperative Lochia: appropriate Uterine Fundus: firm Incision: Dressing is clean, dry, and intact  Labs: Lab Results  Component Value Date   WBC 10.6 (H) 03/24/2017   HGB 8.8 (L) 03/24/2017   HCT 26.9 (L) 03/24/2017   MCV 87.3 03/24/2017   PLT 206 03/24/2017   No flowsheet data found.  Discharge instruction: per After Visit Summary and "Baby and Me Booklet".  After visit meds:  Allergies as of 03/25/2017      Reactions   Baclofen Shortness Of Breath, Other (See Comments)    Ice in her veins   Procaine Hcl Swelling      Medication List    STOP taking these medications   calcium carbonate 500 MG chewable tablet Commonly known as:  TUMS - dosed in mg elemental calcium     TAKE these medications   acetaminophen 325 MG tablet Commonly known as:  TYLENOL Take 650 mg by mouth every 6 (six) hours as needed for moderate pain or headache.   ferrous sulfate 325 (65 FE) MG tablet Take 1 tablet (325 mg total) by mouth 2 (two) times daily with a meal. What changed:  when to take this   ibuprofen 600 MG tablet Commonly known as:  ADVIL,MOTRIN Take 1 tablet (600 mg total) by mouth every 6 (six) hours.   oxyCODONE 5 MG immediate release tablet Commonly known as:  Oxy IR/ROXICODONE Take 1 tablet (5 mg total) by mouth every 4 (four) hours as needed (pain scale 4-7).  PRENATAL PO Take 1 tablet by mouth daily.   pseudoephedrine 30 MG tablet Commonly known as:  SUDAFED Take 30 mg by mouth 2 (two) times daily as needed for congestion.       Diet: routine diet  Activity: Advance as tolerated. Pelvic rest for 6 weeks.   Outpatient follow up:2 weeks Follow up Appt:No future appointments. Follow up Visit:No Follow-up on file.  Postpartum contraception: Undecided  Newborn Data: Live born female  Birth Weight: 7 lb 12.7 oz (3535 g) APGAR: 9, 10  Newborn Delivery   Birth date/time:  03/23/2017 08:54:00 Delivery type:  C-Section, Low Transverse C-section categorization:  Repeat     Baby Feeding: Breast Disposition:home with mother   03/25/2017 Oliver PilaKathy W Marvie Brevik, MD

## 2017-04-04 ENCOUNTER — Inpatient Hospital Stay (HOSPITAL_COMMUNITY)
Admission: AD | Admit: 2017-04-04 | Discharge: 2017-04-04 | Disposition: A | Discharge status: Home / Self Care | Payer: Medicaid Other | Source: Ambulatory Visit | Attending: Obstetrics and Gynecology | Admitting: Obstetrics and Gynecology

## 2017-04-04 ENCOUNTER — Encounter (HOSPITAL_COMMUNITY): Payer: Self-pay | Admitting: *Deleted

## 2017-04-04 DIAGNOSIS — Z87891 Personal history of nicotine dependence: Secondary | ICD-10-CM | POA: Diagnosis not present

## 2017-04-04 DIAGNOSIS — R51 Headache: Secondary | ICD-10-CM | POA: Diagnosis present

## 2017-04-04 DIAGNOSIS — G44219 Episodic tension-type headache, not intractable: Secondary | ICD-10-CM | POA: Diagnosis not present

## 2017-04-04 DIAGNOSIS — R519 Headache, unspecified: Secondary | ICD-10-CM | POA: Diagnosis present

## 2017-04-04 LAB — COMPREHENSIVE METABOLIC PANEL
ALBUMIN: 3.1 g/dL — AB (ref 3.5–5.0)
ALK PHOS: 47 U/L (ref 38–126)
ALT: 19 U/L (ref 14–54)
AST: 17 U/L (ref 15–41)
Anion gap: 7 (ref 5–15)
BUN: 12 mg/dL (ref 6–20)
CALCIUM: 8.6 mg/dL — AB (ref 8.9–10.3)
CHLORIDE: 104 mmol/L (ref 101–111)
CO2: 24 mmol/L (ref 22–32)
CREATININE: 0.81 mg/dL (ref 0.44–1.00)
GFR calc Af Amer: >60 mL/min (ref >60)
GFR calc non Af Amer: >60 mL/min (ref >60)
GLUCOSE: 96 mg/dL (ref 65–99)
Potassium: 3.7 mmol/L (ref 3.5–5.1)
SODIUM: 135 mmol/L (ref 135–145)
Total Bilirubin: 0.3 mg/dL (ref 0.3–1.2)
Total Protein: 6.3 g/dL — ABNORMAL LOW (ref 6.5–8.1)

## 2017-04-04 LAB — CBC
HCT: 32.1 % — ABNORMAL LOW (ref 36.0–46.0)
HEMOGLOBIN: 10.7 g/dL — AB (ref 12.0–15.0)
MCH: 28.6 pg (ref 26.0–34.0)
MCHC: 33.3 g/dL (ref 30.0–36.0)
MCV: 85.8 fL (ref 78.0–100.0)
PLATELETS: 287 10*3/uL (ref 150–400)
RBC: 3.74 MIL/uL — AB (ref 3.87–5.11)
RDW: 15.9 % — ABNORMAL HIGH (ref 11.5–15.5)
WBC: 10.7 10*3/uL — AB (ref 4.0–10.5)

## 2017-04-04 LAB — PROTEIN / CREATININE RATIO, URINE
Creatinine, Urine: 30 mg/dL
Total Protein, Urine: <6 mg/dL

## 2017-04-04 MED ORDER — ACETAMINOPHEN 500 MG PO TABS
1000.0000 mg | ORAL_TABLET | Freq: Once | ORAL | Status: AC
  Administered 2017-04-04: 1000 mg via ORAL
  Filled 2017-04-04: qty 2

## 2017-04-04 NOTE — MAU Note (Addendum)
Pt has had a headache 2 days. Was taking Ibuprofen with some relief. Pt had a nurse neighbor take her BP at home and it was 150/92 at 6pm. Pt did not take ibuprofen since 0430 since her headache was more mild today and she thought it would make her BP go up. Pt has swelling in face 3-4 days ago and feels like she may have an ear infection. She reports a stiff neck and her whole R side of her face hurts. Pt has swelling in legs and hands, but has since gone down. Denies visual changes or epigastric pain.

## 2017-04-04 NOTE — MAU Provider Note (Signed)
History     CSN: [id number]  Arrival date and time: 04/04/17 2101   First Provider Initiated Contact with Patient 04/04/17 2159      Chief Complaint  Patient presents with  . Headache   HPI  Ms.  Laura Carroll is a 42 y.o. year old [id number] female 12 days s/p RCS who presents to MAU reporting H/A for 2 days that was relieved by Ibuprofen. She reports that her neighbor who is a Charity fundraiser took her BP and it was 150/92 @ 1800 today. She has not taken an Ibuprofen since 1630 thinking it "would raise BP". She reports facial swelling 3-4 days ago. She feels as if she is getting an ear infection. She reports that her neck is stiff and the whole RT side of her face hurts. She has swelling in her legs and hand, but it has gone down. She denies visual changes or epigastric pain.   Past Medical History:  Diagnosis Date  . Abnormal Pap smear   . AMA (advanced maternal age) multigravida 35+   . Vaginal Pap smear, abnormal     Past Surgical History:  Procedure Laterality Date  . CESAREAN SECTION  06/01/2011   Procedure: CESAREAN SECTION;  Surgeon: Lavina Hamman, MD;  Location: [facility];  Service: Gynecology;  Laterality: N/A;  . CESAREAN SECTION N/A 03/23/2017   Procedure: REPEAT CESAREAN SECTION;  Surgeon: Lavina Hamman, MD;  Location: [facility];  Service: Obstetrics;  Laterality: N/A;  Heather RNFA  . dental implant     c section  . DILATION AND CURETTAGE OF UTERUS N/A 06/06/2015   Procedure: DILATATION AND CURETTAGE;  Surgeon: Edwinna Areola, DO;  Location: [facility];  Service: Gynecology;  Laterality: N/A;  with ultrasound guidence   . LEEP    . WISDOM TOOTH EXTRACTION      Family History  Problem Relation Age of Onset  . Alcohol abuse Mother   . Anxiety disorder Mother   . Mental illness Mother        schizophrenia  . Cancer Maternal Aunt        lung  . COPD Maternal Grandmother   . Cancer Maternal Grandfather        brain    Social History   Tobacco Use  . Smoking  status: Former Games developer  . Smokeless tobacco: Never Used  Substance Use Topics  . Alcohol use: No  . Drug use: No    Allergies:  Allergies  Allergen Reactions  . Baclofen Shortness Of Breath and Other (See Comments)    Ice in her veins  . Procaine Hcl Swelling    Medications Prior to Admission  Medication Sig Dispense Refill Last Dose  . acetaminophen (TYLENOL) 325 MG tablet Take 650 mg by mouth every 6 (six) hours as needed for moderate pain or headache.   Past Month at Unknown time  . ferrous sulfate 325 (65 FE) MG tablet Take 1 tablet (325 mg total) by mouth 2 (two) times daily with a meal. (Patient taking differently: Take 325 mg by mouth every other day. ) 60 tablet 1 Past Month at Unknown time  . ibuprofen (ADVIL,MOTRIN) 600 MG tablet Take 1 tablet (600 mg total) by mouth every 6 (six) hours. 30 tablet 0 04/04/2017 at Unknown time  . oxyCODONE (OXY IR/ROXICODONE) 5 MG immediate release tablet Take 1 tablet (5 mg total) by mouth every 4 (four) hours as needed (pain scale 4-7). 30 tablet 0   . Prenatal Vit-Fe Fumarate-FA (PRENATAL PO)  Take 1 tablet by mouth daily.     . pseudoephedrine (SUDAFED) 30 MG tablet Take 30 mg by mouth 2 (two) times daily as needed for congestion.    03/08/2017 at Unknown time    Review of Systems  Constitutional: Negative.   HENT: Positive for facial swelling.        "pain in glands under chin and alongside of neck"  Eyes: Negative.   Cardiovascular: Positive for leg swelling (mild; "gone down a lot since earlier").  Endocrine: Negative.   Musculoskeletal: Positive for neck pain.  Allergic/Immunologic: Negative.   Neurological: Positive for headaches.   Physical Exam   Patient Vitals for the past 24 hrs:  BP Temp Temp src Pulse Resp SpO2  04/04/17 2256 134/66 - - (!) 49 - -  04/04/17 2231 138/68 - - (!) 52 - -  04/04/17 2216 134/69 - - (!) 54 - -  04/04/17 2201 (!) 141/70 - - (!) 52 - -  04/04/17 2151 (!) 143/82 - - (!) 57 - -  04/04/17 2131 (!)  151/76 - - (!) 51 - -  04/04/17 2121 (!) 154/69 (!) 97.4 F (36.3 C) Oral (!) 51 16 98 %    Currently breastfeeding.  Physical Exam  Nursing note and vitals reviewed. Constitutional: She is oriented to person, place, and time. She appears well-developed and well-nourished.  HENT:  Head: Normocephalic and atraumatic.  Eyes: Pupils are equal, round, and reactive to light.  Neck: Normal range of motion. Neck supple.  No swollen lymph nodes palpable  Cardiovascular: Normal rate, regular rhythm and normal heart sounds.  Respiratory: Effort normal and breath sounds normal.  GI: Soft. Bowel sounds are normal.  Neurological: She is alert and oriented to person, place, and time. She has normal reflexes.  Skin: Skin is warm and dry.  Psychiatric: She has a normal mood and affect. Her behavior is normal. Judgment and thought content normal.    MAU Course  Procedures  MDM CBC CMP P/C Ratio Serial BPs  * TC to Dr. Jackelyn KnifeMeisinger in error @ 2250 *Consult with Dr. Mindi SlickerBanga @ 2305 - notified of patient's complaints, assessments, lab results & H/A relieved with Tylenol, recommended tx plan d/c home with instructions to alternate Tylenol and Ibuprofen for H/A relief, F/U as scheduled  Results for orders placed or performed during the hospital encounter of 04/04/17 (from the past 24 hour(s))  Protein / creatinine ratio, urine     Status: None   Collection Time: 04/04/17  9:05 PM  Result Value Ref Range   Creatinine, Urine 30.00 mg/dL   Total Protein, Urine <6 mg/dL   Protein Creatinine Ratio        0.00 - 0.15 mg/mg[Cre]  CBC     Status: Abnormal   Collection Time: 04/04/17  9:33 PM  Result Value Ref Range   WBC 10.7 (H) 4.0 - 10.5 K/uL   RBC 3.74 (L) 3.87 - 5.11 MIL/uL   Hemoglobin 10.7 (L) 12.0 - 15.0 g/dL   HCT 81.132.1 (L) 91.436.0 - 78.246.0 %   MCV 85.8 78.0 - 100.0 fL   MCH 28.6 26.0 - 34.0 pg   MCHC 33.3 30.0 - 36.0 g/dL   RDW 95.615.9 (H) 21.311.5 - 08.615.5 %   Platelets 287 150 - 400 K/uL  Comprehensive  metabolic panel     Status: Abnormal   Collection Time: 04/04/17  9:33 PM  Result Value Ref Range   Sodium 135 135 - 145 mmol/L   Potassium 3.7 3.5 -  5.1 mmol/L   Chloride 104 101 - 111 mmol/L   CO2 24 22 - 32 mmol/L   Glucose, Bld 96 65 - 99 mg/dL   BUN 12 6 - 20 mg/dL   Creatinine, Ser 8.11 0.44 - 1.00 mg/dL   Calcium 8.6 (L) 8.9 - 10.3 mg/dL   Total Protein 6.3 (L) 6.5 - 8.1 g/dL   Albumin 3.1 (L) 3.5 - 5.0 g/dL   AST 17 15 - 41 U/L   ALT 19 14 - 54 U/L   Alkaline Phosphatase 47 38 - 126 U/L   Total Bilirubin 0.3 0.3 - 1.2 mg/dL   GFR calc non Af Amer >60 >60 mL/min   GFR calc Af Amer >60 >60 mL/min   Anion gap 7 5 - 15    Assessment and Plan  Episodic tension-type headache, not intractable  - Plan: Discharge patient - Advised to alternate Ibuprofen and Tylenol prn for pain - Stay well-hydrated - Rest as much as possible - Keep scheduled appt with Dr. Jackelyn Knife - Patient verbalized an understanding of the plan of care and agrees.   Raelyn Mora, MSN, CNM 04/04/2017, 9:59 PM

## 2017-12-21 IMAGING — US US MFM OB DETAIL+14 WK
1 series · 13 of 28 positions shown · non-contrast
Comparison: none

[Series 1: us mfm ob detail+14 wk · 13 of 71 slices shown]
[im 3/71]
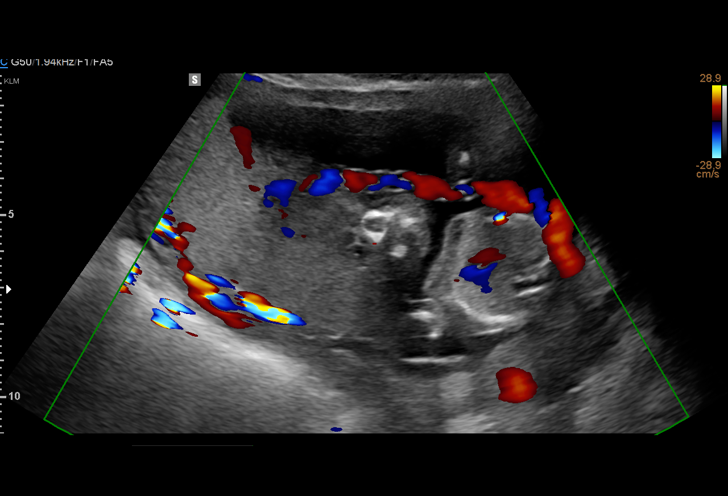
[im 8/71]
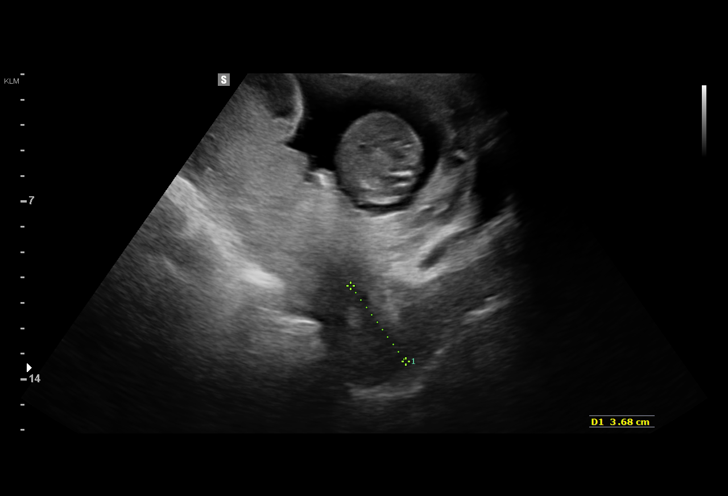
[im 13/71]
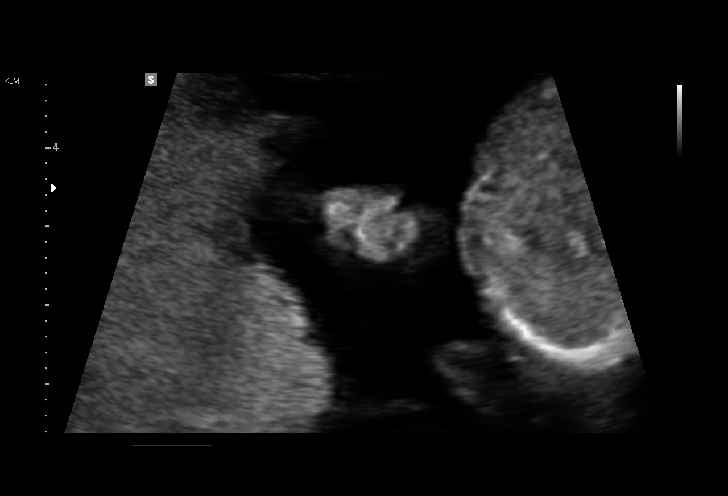
[im 19/71]
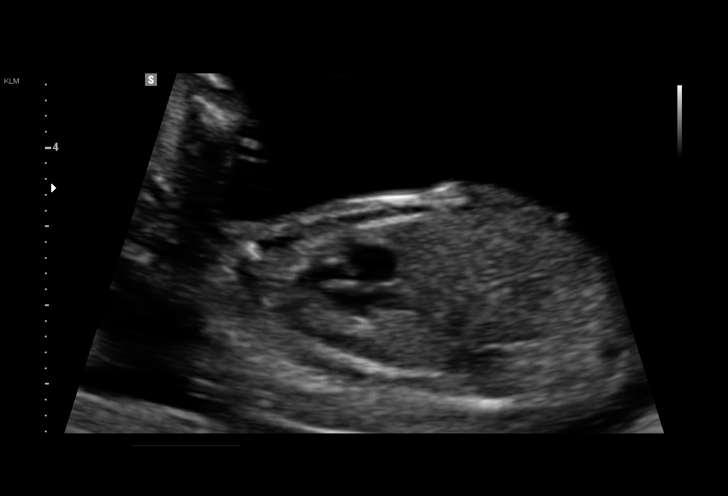
[im 24/71]
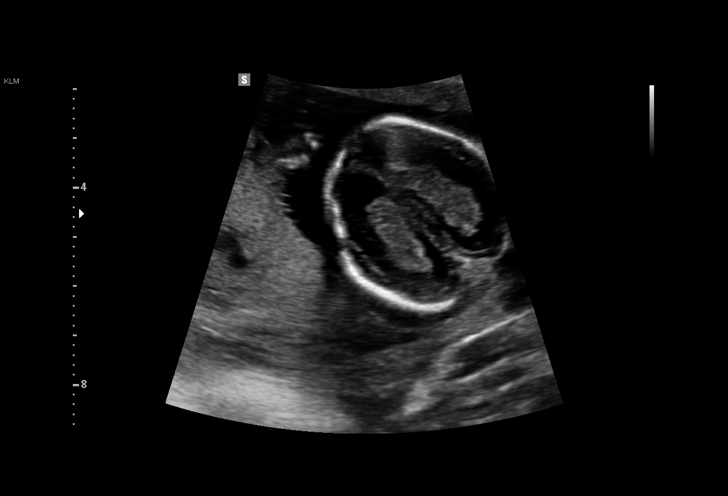
[im 29/71]
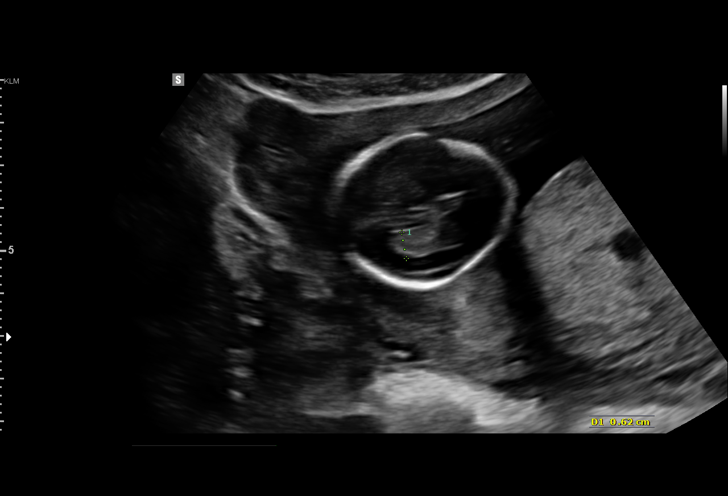
[im 37/71]
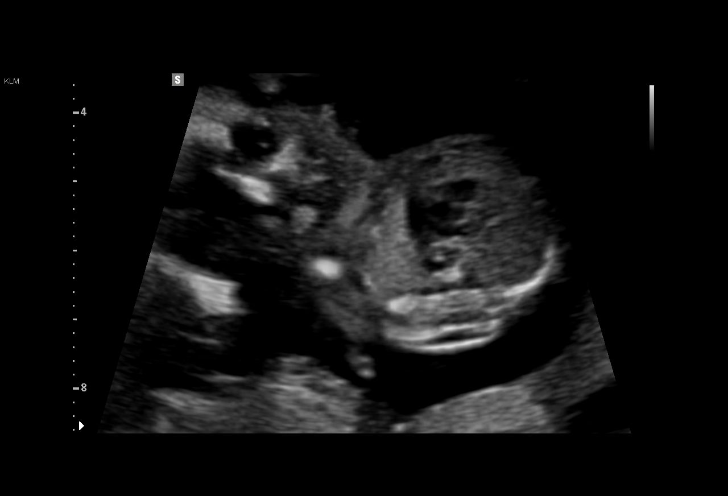
[im 42/71]
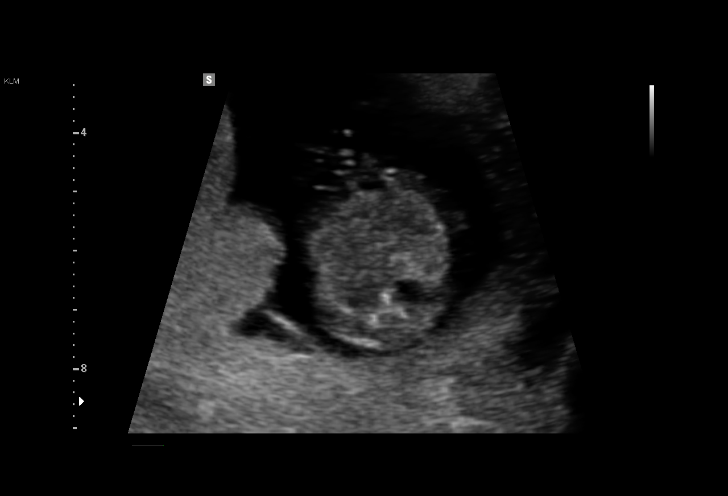
[im 47/71]
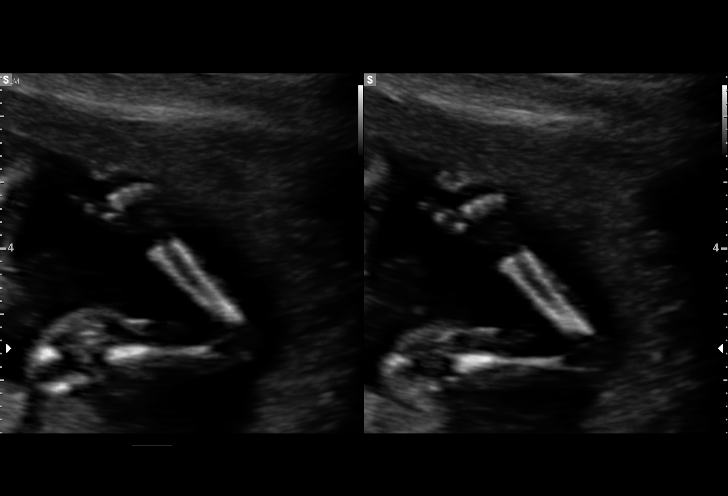
[im 52/71]
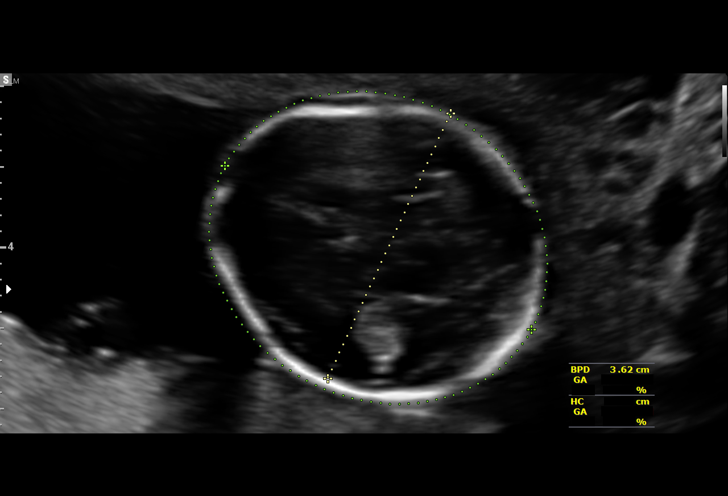
[im 58/71]
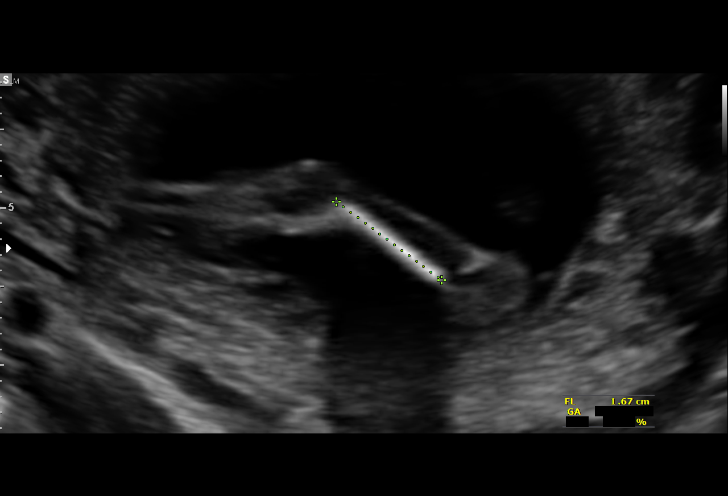
[im 63/71]
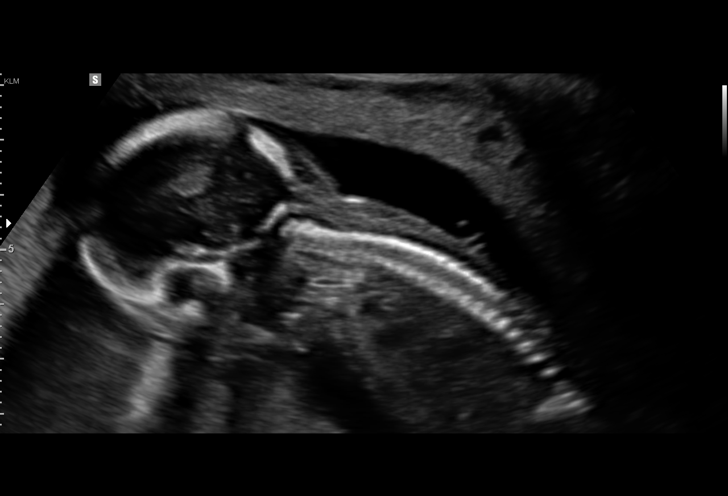
[im 68/71]
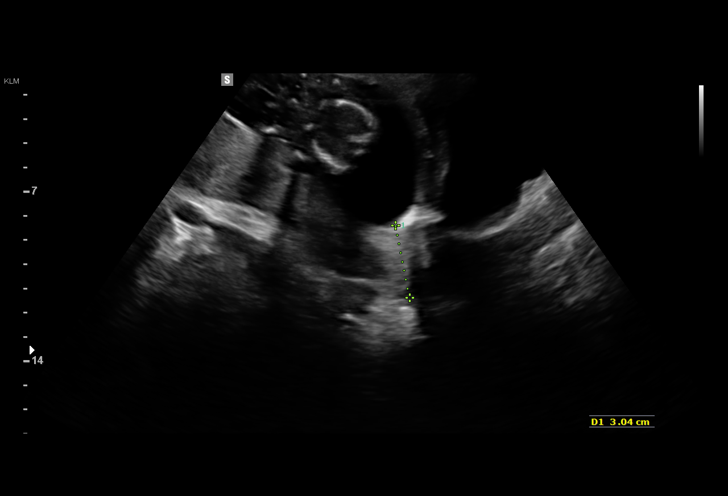

[13 of 28 positions shown; findings below may reference images not displayed]

[HOSPITAL],
Inc[facility]

1  [facility]           [id number]       [id number]     [id number]
2  [facility]           [id number]       [id number]     [id number]
Indications

Advanced maternal age multigravida 35+,
second trimester
15 weeks gestation of pregnancy
Abnormal biochemical screen (quad) for
Trisomy 21
Fetal abnormality - other known or
suspected (nuchal fold thickening)
Detailed fetal anatomic survey                 Z36
OB History

Gravidity:    5         Term:   3        Prem:   0        SAB:   1
TOP:          0       Ectopic:  0        Living: 3
Fetal Evaluation

Num Of Fetuses:     1
Fetal Heart         145
Rate(bpm):
Cardiac Activity:   Observed
Presentation:       Variable
Placenta:           Posterior, above cervical os
P. Cord Insertion:  Visualized

Amniotic Fluid
AFI FV:      Subjectively within normal limits
Larg Pckt:     3.3  cm
Biometry

BPD:        36  mm     G. Age:  17w 0d                  CI:        80.19   %    70 - 86
FL/HC:      13.9   %    13.3 -
HC:       127   mm     G. Age:  16w 3d         74  %    HC/AC:      1.19        1.05 -
AC:      106.5  mm     G. Age:  16w 4d         79  %    FL/BPD:     48.9   %
FL:       17.6  mm     G. Age:  15w 1d         25  %    FL/AC:      16.5   %    20 - 24
HUM:      17.8  mm     G. Age:  15w 0d         35  %

Est. FW:     141  gm      0 lb 5 oz   > 75  %
Gestational Age

LMP:           15w 5d        Date:  02/02/15                 EDD:   11/09/15
U/S Today:     16w 2d                                        EDD:   11/05/15
Best:          15w 5d     Det. By:  LMP  (02/02/15)          EDD:   11/09/15
Anatomy

Cranium:          Appears normal         Aortic Arch:      Appears normal
Fetal Cavum:      Appears normal         Ductal Arch:      Appears normal
Ventricles:       Appears normal         Diaphragm:        Appears normal
Choroid Plexus:   Appears normal         Stomach:          Appears normal, left
sided
Cerebellum:       Appears normal         Abdomen:          Appears normal
Posterior Fossa:  Appears normal         Abdominal Wall:   Appears nml (cord
insert, abd wall)
Nuchal Fold:      Appears normal         Cord Vessels:     Appears normal (3
vessel cord)
Face:             Appears normal         Kidneys:          Appear normal
(orbits and profile)
Lips:             Appears normal         Bladder:          Appears normal
Fetal Thoracic:   Appears normal         Spine:            Not well visualized
Heart:            Appears normal         Upper             Appears normal
(4CH, axis, and        Extremities:
situs)
RVOT:             Appears normal         Lower             Appears normal
Extremities:
LVOT:             Appears normal

Other:  Fetus appears to be a male. Heels visualized.
Guided Procedures

Type:   Amniocentesis for fetal genetic study

FH Post Procedure:     Normal             RH Type:          O+
Rh Immune Globulin:    Not required,      Discharge Inst.:  Post-procedure
Rh positive                          instructions
given
Needle Insertions:     22 gauge x 1       Vol. Withdrawn:   15 ml of clear
amniotic fluid

Complications:  None

Comment:                    Amniocentesis performed after obtaining informed consent and performing  a "time-out".
Cervix Uterus Adnexa
Cervix
Length:              3  cm.
Normal appearance by transabdominal scan.

Left Ovary
Within normal limits.

Right Ovary
Within normal limits.
Comments

The choroid plexus is noted to be mottled and although not
as bright as bone, the bowel appears prominent in
echogenicity.  Although these are both technically normal
findings, with Ms. Per-Egil had cell-free DNA testing that
was high risk for Downs Syndrome.  Thus, I think the choroid
and bowel are further markers of aneuploidy.

The risks, benefits, and alternatives of amniocentesis were
discussed with the patient including a less than 1% risk of
pregnancy loss.  She provided written and verbal consent to
the procedure.  An amniocentesis was performed without
complication as described above.
Impression

Single living intrauterine pregnancy at 15 weeks 5 days.
Appropriate fetal growth.
Normal amniotic fluid volume.
The fetal anatomic survey is not complete.
No gross fetal anomalies identified.
See comments.
Recommendations

Follow up will be dictated by the results of the amniocentesis.

## 2018-03-18 DIAGNOSIS — J020 Streptococcal pharyngitis: Secondary | ICD-10-CM | POA: Diagnosis not present

## 2018-11-01 DIAGNOSIS — Z6822 Body mass index (BMI) 22.0-22.9, adult: Secondary | ICD-10-CM | POA: Diagnosis not present

## 2018-11-01 DIAGNOSIS — R1011 Right upper quadrant pain: Secondary | ICD-10-CM | POA: Diagnosis not present

## 2018-11-20 DIAGNOSIS — Z23 Encounter for immunization: Secondary | ICD-10-CM | POA: Diagnosis not present

## 2018-12-26 DIAGNOSIS — Z1389 Encounter for screening for other disorder: Secondary | ICD-10-CM | POA: Diagnosis not present

## 2018-12-26 DIAGNOSIS — Z6822 Body mass index (BMI) 22.0-22.9, adult: Secondary | ICD-10-CM | POA: Diagnosis not present

## 2018-12-26 DIAGNOSIS — Z01419 Encounter for gynecological examination (general) (routine) without abnormal findings: Secondary | ICD-10-CM | POA: Diagnosis not present

## 2018-12-26 DIAGNOSIS — Z130 Encounter for screening for diseases of the blood and blood-forming organs and certain disorders involving the immune mechanism: Secondary | ICD-10-CM | POA: Diagnosis not present

## 2019-10-30 DIAGNOSIS — Z23 Encounter for immunization: Secondary | ICD-10-CM | POA: Diagnosis not present

## 2019-11-07 ENCOUNTER — Encounter: Payer: Self-pay | Admitting: Internal Medicine

## 2019-11-19 ENCOUNTER — Other Ambulatory Visit: Payer: BC Managed Care – PPO

## 2019-11-19 ENCOUNTER — Encounter: Payer: Self-pay | Admitting: Internal Medicine

## 2019-11-19 ENCOUNTER — Ambulatory Visit: Payer: BC Managed Care – PPO | Admitting: Internal Medicine

## 2019-11-19 VITALS — BP 100/70 | HR 72 | Ht 65.5 in | Wt 140.0 lb

## 2019-11-19 DIAGNOSIS — K5909 Other constipation: Secondary | ICD-10-CM

## 2019-11-19 DIAGNOSIS — K642 Third degree hemorrhoids: Secondary | ICD-10-CM

## 2019-11-19 DIAGNOSIS — R140 Abdominal distension (gaseous): Secondary | ICD-10-CM

## 2019-11-19 NOTE — Patient Instructions (Signed)
Your provider has requested that you go to the basement level for lab work before leaving today. Press "B" on the elevator. The lab is located at the first door on the left as you exit the elevator.   Please do the Sitzmarker test as follows: Take the capsule on Wednesday 11/21/2019 and then come 11/26/2019 for a x-ray. The x-ray department # is [phone]. They are located in our basement.   I appreciate the opportunity to care for you. Denice Paradise, [facility]

## 2019-11-19 NOTE — Progress Notes (Signed)
Laura Carroll 44 y.o. 1975-05-30 [id number]  Assessment & Plan:   Encounter Diagnoses  Name Primary?  . Chronic constipation Yes  . Bloating   . Prolapsed internal hemorrhoids, grade 3    The history sounds somewhat like slow transit constipation.  I will have her do a Sitzmarks test.  I do not get a strong sense that she has pelvic floor dysfunction though it is possible.  Screen for celiac disease.  Consider small intestinal bacterial overgrowth testing.  Once I get the results of the celiac screening and the Sitzmarks test will determine next step.  I think banding of hemorrhoids is reasonable they were grade 2 on anoscopy today but history supports grade 3.  However I think bowel habits need to be improved first to get a better response.   Orders Placed This Encounter  Procedures  . DG Abd 1 View  . Tissue transglutaminase, IgA  . IgA    I appreciate the opportunity to care for this patient. CC: Ronal Fear, NP    Subjective:   Chief Complaint: Bloating and constipation abdominal pain and gas  HPI This 44 year old married white woman presents with complaints of relatively chronic constipation associated bloating and abdominal discomfort for many years.  She goes 4 to 5 days without a bowel movement she will bloat and distend and then have multiple bowel movements after taking multiple Senokot pills.  Sometimes she'll feel prolapse of hemorrhoids and will have to manually reduce.  In addition swelling in the anal area is related and she'll use over-the-counter medication such as Tucks pads.  She does not bleed from these.  These have been intermittently symptomatic since delivering second child at age 37.  She has tried to increase fluids like water recently she has been moving more towards a "clean diet" but has not noted any significant food triggers or relationships to eating with her symptoms.  Trying to eat less processed foods lately though it doesn't sound  like she ate a lot before.  She has had 4 children the first 2 were large and vaginal births in the 1990s second 2 were C-sections in 2013 and 2019.  She does not have significant stress urinary incontinence.  She has tried MiraLAX and fiber in the past but not for prolonged period just very briefly.  Other symptoms include nausea without vomiting.  GI review of systems is otherwise negative.  She has not noted any symptom relationship with any of her medications either.  No prior gastroenterology evaluation. Allergies  Allergen Reactions  . Baclofen Shortness Of Breath and Other (See Comments)    Ice in her veins  . Procaine Hcl Swelling   Current Meds  Medication Sig  . ELDERBERRY PO Take 1 tablet by mouth daily.  . ferrous sulfate 325 (65 FE) MG tablet Take 1 tablet (325 mg total) by mouth 2 (two) times daily with a meal. (Patient taking differently: Take 325 mg by mouth every other day. )  . ibuprofen (ADVIL,MOTRIN) 600 MG tablet Take 1 tablet (600 mg total) by mouth every 6 (six) hours.  . Multiple Vitamins-Minerals (MULTIVITAMIN WOMEN PO) Take 1 tablet by mouth daily.  Marland Kitchen VITAMIN D PO Take 1 tablet by mouth daily.  . Zinc 50 MG TABS Take 1 tablet by mouth daily.   Past Medical History:  Diagnosis Date  . Abnormal Pap smear   . AMA (advanced maternal age) multigravida 35+   . Anemia   . Heart palpitations   .  Vaginal Pap smear, abnormal    Past Surgical History:  Procedure Laterality Date  . CESAREAN SECTION  06/01/2011   Procedure: CESAREAN SECTION;  Surgeon: Lavina Hamman, MD;  Location: [facility];  Service: Gynecology;  Laterality: N/A;  . CESAREAN SECTION N/A 03/23/2017   Procedure: REPEAT CESAREAN SECTION;  Surgeon: Lavina Hamman, MD;  Location: [place] [facility];  Service: Obstetrics;  Laterality: N/A;  Heather RNFA  . DILATION AND CURETTAGE OF UTERUS N/A 06/06/2015   Procedure: DILATATION AND CURETTAGE;  Surgeon: Edwinna Areola, DO;  Location: [facility];  Service:  Gynecology;  Laterality: N/A;  with ultrasound guidence   . LEEP    . WISDOM TOOTH EXTRACTION     Social History   Social History Narrative   Patient is married she has 2 sons and 2 daughters birth years range from [phone]   She is a Warden/ranger   Former smoker no drugs tobacco now 2 or 3 alcoholic drinks per week 1 coffee in the morning   family history includes Alcohol abuse in her mother; Anxiety disorder in her mother; Brain cancer in her maternal grandfather; Breast cancer in her paternal aunt and sister; COPD in her maternal grandmother; Lung cancer in her paternal aunt; Mental illness in her mother.   Review of Systems As per HPI Back pain fatigue menstrual pain and insomnia all other review of systems are negative  Objective:   Physical Exam BP 100/70 (BP Location: Left Arm, Patient Position: Sitting, Cuff Size: Normal)   Pulse 72   Ht 5' 5.5" (1.664 m) Comment: height measured without shoes  Wt 140 lb (63.5 kg)   LMP 11/17/2019   Breastfeeding No   BMI 22.94 kg/m  Well-developed well-nourished white woman no acute distress Eyes are anicteric The lung fields are clear Heart sounds are normal The abdomen is soft and nontender without organomegaly or mass or hernia  Merri Ray, CMA present   for rectal exam, normal anoderm normal resting tone and voluntary squeeze and no abnormal descent no masses no rectocele  Anoscopy grade 2 internal hemorrhoids all 3 positions

## 2019-11-20 LAB — TISSUE TRANSGLUTAMINASE, IGA: (tTG) Ab, IgA: <1 U/mL

## 2019-11-20 LAB — IGA: Immunoglobulin A: 261 mg/dL (ref 47–310)

## 2019-11-21 ENCOUNTER — Encounter: Payer: Self-pay | Admitting: Internal Medicine

## 2019-11-26 ENCOUNTER — Other Ambulatory Visit: Payer: Self-pay

## 2019-11-26 ENCOUNTER — Ambulatory Visit (INDEPENDENT_AMBULATORY_CARE_PROVIDER_SITE_OTHER)
Admission: RE | Admit: 2019-11-26 | Discharge: 2019-11-26 | Disposition: A | Discharge status: Home / Self Care | Payer: BC Managed Care – PPO | Source: Ambulatory Visit | Attending: Internal Medicine | Admitting: Internal Medicine

## 2019-11-26 DIAGNOSIS — K5909 Other constipation: Secondary | ICD-10-CM | POA: Diagnosis not present

## 2019-11-26 DIAGNOSIS — R935 Abnormal findings on diagnostic imaging of other abdominal regions, including retroperitoneum: Secondary | ICD-10-CM | POA: Diagnosis not present

## 2020-01-04 DIAGNOSIS — Z1231 Encounter for screening mammogram for malignant neoplasm of breast: Secondary | ICD-10-CM | POA: Diagnosis not present

## 2020-01-04 DIAGNOSIS — Z124 Encounter for screening for malignant neoplasm of cervix: Secondary | ICD-10-CM | POA: Diagnosis not present

## 2020-01-04 DIAGNOSIS — Z1151 Encounter for screening for human papillomavirus (HPV): Secondary | ICD-10-CM | POA: Diagnosis not present

## 2020-01-04 DIAGNOSIS — Z130 Encounter for screening for diseases of the blood and blood-forming organs and certain disorders involving the immune mechanism: Secondary | ICD-10-CM | POA: Diagnosis not present

## 2020-01-04 DIAGNOSIS — Z01419 Encounter for gynecological examination (general) (routine) without abnormal findings: Secondary | ICD-10-CM | POA: Diagnosis not present

## 2022-06-26 IMAGING — DX DG ABDOMEN 1V
1 series · 1 of 1 positions shown · non-contrast
Comparison: None.

CLINICAL DATA: Sitz markers.

EXAM:
ABDOMEN - 1 VIEW

[abdomen kub]
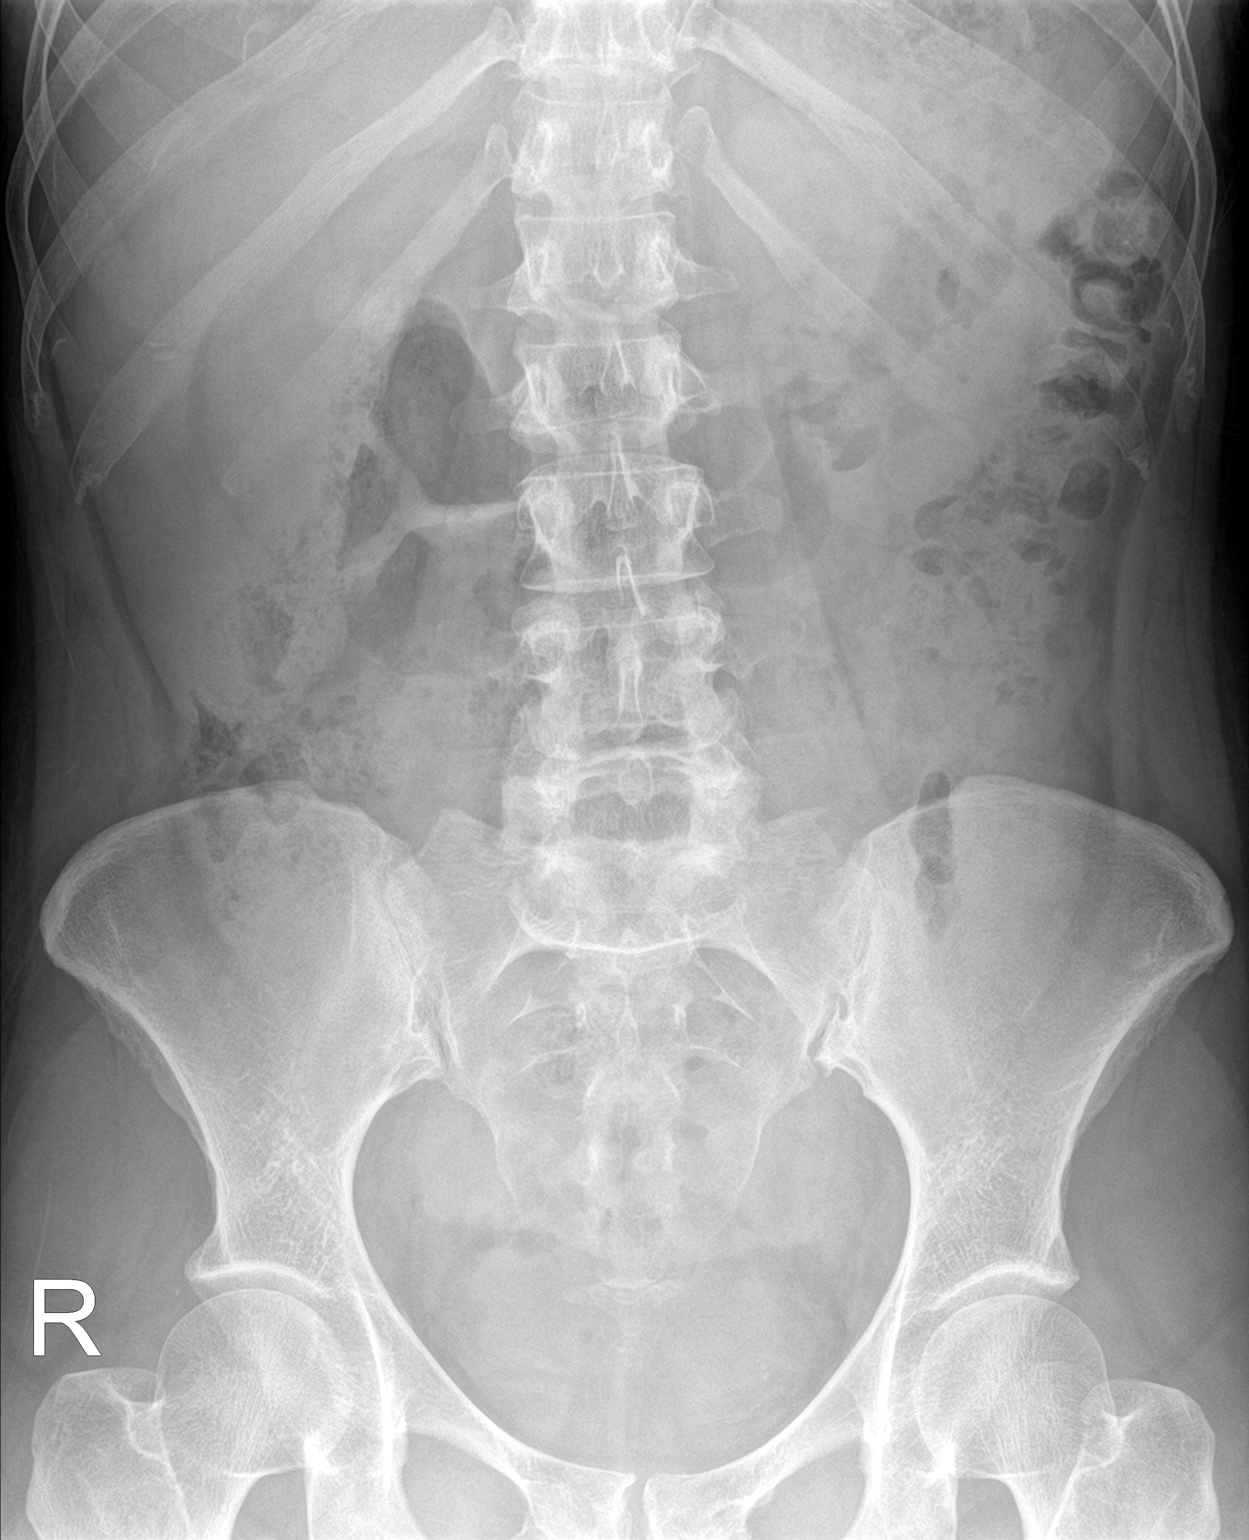

[1 of 1 positions shown; findings below may reference images not displayed]

FINDINGS: The bowel gas pattern is nonobstructive. There is a slightly above
average amount of stool in the colon. No Sitz markers were
visualized on this study. There is a faint increased density
overlying the right twelfth rib and right renal shadow. The upper
abdomen was not visualized on this study. There are no definite
radiopaque kidney stones.
IMPRESSION: 1. No Sitz markers visualized.
2. Faint density projecting over the right twelfth ribs and right
kidney. This is of unknown clinical significance. This may represent
gallstones, versus an atypical appearance of a right-sided
nephrolith, versus hyperdense enteric material. A repeat radiograph
is recommended for further evaluation.

## 2022-08-23 ENCOUNTER — Encounter: Payer: Self-pay | Admitting: Internal Medicine

## 2022-09-14 ENCOUNTER — Ambulatory Visit (AMBULATORY_SURGERY_CENTER): Payer: BC Managed Care – PPO

## 2022-09-14 VITALS — Ht 65.0 in | Wt 145.0 lb

## 2022-09-14 DIAGNOSIS — Z1211 Encounter for screening for malignant neoplasm of colon: Secondary | ICD-10-CM

## 2022-09-14 MED ORDER — NA SULFATE-K SULFATE-MG SULF 17.5-3.13-1.6 GM/177ML PO SOLN: 1.0000 | Freq: Once | ORAL | 0 refills | Status: AC

## 2022-09-14 NOTE — Progress Notes (Signed)
No egg or soy allergy known to patient  No issues known to pt with past sedation with any surgeries or procedures Patient denies ever being told they had issues or difficulty with intubation  No FH of Malignant Hyperthermia Pt is not on diet pills Pt is not on  home 02  Pt is not on blood thinners  Pt states occasional issues with chronic constipation; increasing water has helped No A fib or A flutter Have any cardiac testing pending--no Patient's chart reviewed by Cathlyn Parsons CNRA prior to previsit and patient appropriate for the [facility].  Previsit completed and red dot placed by patient's name on their procedure day (on provider's schedule).    Ambulates independently Pt instructed to use [email or web address].com or GoodRx for a price reduction on prep

## 2022-09-15 ENCOUNTER — Encounter: Payer: Self-pay | Admitting: Internal Medicine

## 2022-09-28 ENCOUNTER — Telehealth: Payer: Self-pay | Admitting: Internal Medicine

## 2022-09-28 DIAGNOSIS — Z1211 Encounter for screening for malignant neoplasm of colon: Secondary | ICD-10-CM

## 2022-09-28 MED ORDER — NA SULFATE-K SULFATE-MG SULF 17.5-3.13-1.6 GM/177ML PO SOLN: 1.0000 | Freq: Once | ORAL | 0 refills | Status: AC

## 2022-09-28 NOTE — Telephone Encounter (Signed)
Patient needs prep medication sent into the pharmacy. Please advise

## 2022-09-28 NOTE — Telephone Encounter (Signed)
Medication sent to the pharmacy in [facility] as pt requested.

## 2022-09-28 NOTE — Telephone Encounter (Signed)
Pt has instructions at home but no med at pharmacy. Clarified with pt it was Suprep and order to be placed. Gave Website for coupon information as well as name of med to obtain coupon.  Instructed pt to pick up med as soon as possible and pt stated she will.

## 2022-10-05 ENCOUNTER — Encounter: Payer: BC Managed Care – PPO | Admitting: Internal Medicine

## 2022-11-13 ENCOUNTER — Encounter: Payer: Self-pay | Admitting: Certified Registered Nurse Anesthetist

## 2022-11-15 ENCOUNTER — Telehealth: Payer: Self-pay | Admitting: Internal Medicine

## 2022-11-15 ENCOUNTER — Encounter: Payer: Self-pay | Admitting: Internal Medicine

## 2022-11-15 NOTE — Telephone Encounter (Signed)
PT is calling to get updated instruction times and to have a nurse go over the information with her. Please advise.

## 2022-11-16 NOTE — Telephone Encounter (Signed)
Left message for pt to call back  My chart message sent with Prep instructions. Pt was made aware on voice message

## 2022-11-17 ENCOUNTER — Encounter: Payer: Self-pay | Admitting: Internal Medicine

## 2022-11-17 ENCOUNTER — Ambulatory Visit (AMBULATORY_SURGERY_CENTER): Payer: BC Managed Care – PPO | Admitting: Internal Medicine

## 2022-11-17 VITALS — BP 121/75 | HR 64 | Temp 97.5°F | Resp 12 | Ht 65.0 in | Wt 145.0 lb

## 2022-11-17 DIAGNOSIS — Z1211 Encounter for screening for malignant neoplasm of colon: Secondary | ICD-10-CM | POA: Diagnosis present

## 2022-11-17 MED ORDER — SODIUM CHLORIDE 0.9 % IV SOLN: 500.0000 mL | Freq: Once | INTRAVENOUS | Status: DC

## 2022-11-17 NOTE — Progress Notes (Signed)
[facility] History and Physical   Primary Care Physician:  Ronal Fear, NP (Inactive)   Reason for Procedure:    Encounter Diagnosis  Name Primary?   Special screening for malignant neoplasms, colon Yes     Plan:    colonoscopy     HPI: Laura Carroll is a 47 y.o. female here for initial screening   Past Medical History:  Diagnosis Date   Abnormal Pap smear    AMA (advanced maternal age) multigravida 35+    Anemia    Heart palpitations    Vaginal Pap smear, abnormal     Past Surgical History:  Procedure Laterality Date   CESAREAN SECTION  06/01/2011   Procedure: CESAREAN SECTION;  Surgeon: Lavina Hamman, MD;  Location: [facility];  Service: Gynecology;  Laterality: N/A;   CESAREAN SECTION N/A 03/23/2017   Procedure: REPEAT CESAREAN SECTION;  Surgeon: Lavina Hamman, MD;  Location: [facility];  Service: Obstetrics;  Laterality: N/A;  Heather RNFA   DILATION AND CURETTAGE OF UTERUS N/A 06/06/2015   Procedure: DILATATION AND CURETTAGE;  Surgeon: Edwinna Areola, DO;  Location: [facility];  Service: Gynecology;  Laterality: N/A;  with ultrasound guidence    LEEP     WISDOM TOOTH EXTRACTION      Prior to Admission medications   Medication Sig Start Date End Date Taking? Authorizing Provider  Multiple Vitamins-Minerals (MULTIVITAMIN WOMEN PO) Take 1 tablet by mouth daily.   Yes [provider]  ELDERBERRY PO Take 1 tablet by mouth daily. Patient not taking: Reported on 09/14/2022    [provider]  ferrous sulfate 325 (65 FE) MG tablet Take 1 tablet (325 mg total) by mouth 2 (two) times daily with a meal. Patient taking differently: Take 200 mg by mouth every other day. 06/07/15   Banga, Sharol Given, DO  ibuprofen (ADVIL,MOTRIN) 600 MG tablet Take 1 tablet (600 mg total) by mouth every 6 (six) hours. 03/25/17   Huel Cote, MD  VITAMIN D PO Take 1 tablet by mouth daily. Patient not taking: Reported on 09/14/2022    [provider]  Zinc 50 MG TABS Take 1 tablet by mouth daily. Patient not taking: Reported on 09/14/2022    [provider]    Current Outpatient Medications  Medication Sig Dispense Refill   Multiple Vitamins-Minerals (MULTIVITAMIN WOMEN PO) Take 1 tablet by mouth daily.     ELDERBERRY PO Take 1 tablet by mouth daily. (Patient not taking: Reported on 09/14/2022)     ferrous sulfate 325 (65 FE) MG tablet Take 1 tablet (325 mg total) by mouth 2 (two) times daily with a meal. (Patient taking differently: Take 200 mg by mouth every other day.) 60 tablet 1   ibuprofen (ADVIL,MOTRIN) 600 MG tablet Take 1 tablet (600 mg total) by mouth every 6 (six) hours. 30 tablet 0   VITAMIN D PO Take 1 tablet by mouth daily. (Patient not taking: Reported on 09/14/2022)     Zinc 50 MG TABS Take 1 tablet by mouth daily. (Patient not taking: Reported on 09/14/2022)     Current Facility-Administered Medications  Medication Dose Route Frequency Provider Last Rate Last Admin   0.9 %  sodium chloride infusion  500 mL Intravenous Once Iva Boop, MD        Allergies as of 11/17/2022 - Review Complete 11/17/2022  Allergen Reaction Noted   Baclofen Shortness Of Breath and Other (See Comments) 03/08/2017   Procaine hcl Swelling 05/14/2011  Family History  Problem Relation Age of Onset   Alcohol abuse Mother    Anxiety disorder Mother    Mental illness Mother        schizophrenia   Breast cancer Sister    Lung cancer Paternal Aunt        smoker   Breast cancer Paternal Aunt    COPD Maternal Grandmother    Brain cancer Maternal Grandfather    Colon cancer Neg Hx    Colon polyps Neg Hx    Esophageal cancer Neg Hx    Rectal cancer Neg Hx    Stomach cancer Neg Hx     Social History   Socioeconomic History   Marital status: Married    Spouse name: Not on file   Number of children: 4   Years of education: Not on file   Highest education level: Not on file  Occupational History    Occupation: Finacial services rep  Tobacco Use   Smoking status: Former    Current packs/day: 0.00    Types: Cigarettes    Quit date: 11/18/2013    Years since quitting: 9.0   Smokeless tobacco: Never  Vaping Use   Vaping status: Never Used  Substance and Sexual Activity   Alcohol use: Yes    Comment: 2-3 per week   Drug use: No   Sexual activity: Yes  Other Topics Concern   Not on file  Social History Narrative   Patient is married she has 2 sons and 2 daughters birth years range from 76 96-20 91   She is a Warden/ranger   Former smoker no drugs tobacco now 2 or 3 alcoholic drinks per week 1 coffee in the morning   Social Determinants of Health   Financial Resource Strain: Not on file  Food Insecurity: No Food Insecurity (02/12/2020)   Received from [facility], [facility]   Hunger Vital Sign    Worried About Running Out of Food in the Last Year: Never true    Ran Out of Food in the Last Year: Never true  Transportation Needs: Not on file  Physical Activity: Not on file  Stress: Not on file  Social Connections: Unknown (06/16/2021)   Received from [facility], [facility]   Social Network    Social Network: Not on file  Intimate Partner Violence: Unknown (05/08/2021)   Received from [facility], [facility]   HITS    Physically Hurt: Not on file    Insult or Talk Down To: Not on file    Threaten Physical Harm: Not on file    Scream or Curse: Not on file    Review of Systems:  All other review of systems negative except as mentioned in the HPI.  Physical Exam: Vital signs BP 139/76   Pulse (!) 59   Temp (!) 97.5 F (36.4 C) (Temporal)   Ht 5\' 5"  (1.651 m)   Wt 145 lb (65.8 kg)   LMP 11/04/2022   SpO2 100%   BMI 24.13 kg/m   General:   Alert,  Well-developed, well-nourished, pleasant and cooperative in NAD Lungs:  Clear throughout to auscultation.   Heart:  Regular rate and rhythm; no murmurs, clicks, rubs,  or  gallops. Abdomen:  Soft, nontender and nondistended. Normal bowel sounds.   Neuro/Psych:  Alert and cooperative. Normal mood and affect. A and O x 3   @Sarita Hakanson  Sena Slate, MD, [facility] [phone] (pager) 11/17/2022 10:31 AM@

## 2022-11-17 NOTE — Patient Instructions (Addendum)
Please read handouts provided. Continue present medications. Repeat colonoscopy in 10 years for screening.   YOU HAD AN ENDOSCOPIC PROCEDURE TODAY AT THE [facility]:   Refer to the procedure report that was given to you for any specific questions about what was found during the examination.  If the procedure report does not answer your questions, please call your gastroenterologist to clarify.  If you requested that your care partner not be given the details of your procedure findings, then the procedure report has been included in a sealed envelope for you to review at your convenience later.  YOU SHOULD EXPECT: Some feelings of bloating in the abdomen. Passage of more gas than usual.  Walking can help get rid of the air that was put into your GI tract during the procedure and reduce the bloating. If you had a lower endoscopy (such as a colonoscopy or flexible sigmoidoscopy) you may notice spotting of blood in your stool or on the toilet paper. If you underwent a bowel prep for your procedure, you may not have a normal bowel movement for a few days.  Please Note:  You might notice some irritation and congestion in your nose or some drainage.  This is from the oxygen used during your procedure.  There is no need for concern and it should clear up in a day or so.  SYMPTOMS TO REPORT IMMEDIATELY:  Following lower endoscopy (colonoscopy or flexible sigmoidoscopy):  Excessive amounts of blood in the stool  Significant tenderness or worsening of abdominal pains  Swelling of the abdomen that is new, acute  Fever of 100F or higher.  For urgent or emergent issues, a gastroenterologist can be reached at any hour by calling [phone]. Do not use MyChart messaging for urgent concerns.    DIET:  We do recommend a small meal at first, but then you may proceed to your regular diet.  Drink plenty of fluids but you should avoid alcoholic beverages for 24 hours.  ACTIVITY:  You should  plan to take it easy for the rest of today and you should NOT DRIVE or use heavy machinery until tomorrow (because of the sedation medicines used during the test).    FOLLOW UP: Our staff will call the number listed on your records the next business day following your procedure.  We will call around 7:15- 8:00 am to check on you and address any questions or concerns that you may have regarding the information given to you following your procedure. If we do not reach you, we will leave a message.     If any biopsies were taken you will be contacted by phone or by letter within the next 1-3 weeks.  Please call us at [phone] if you have not heard about the biopsies in 3 weeks.    SIGNATURES/CONFIDENTIALITY: You and/or your care partner have signed paperwork which will be entered into your electronic medical record.  These signatures attest to the fact that that the information above on your After Visit Summary has been reviewed and is understood.  Full responsibility of the confidentiality of this discharge information lies with you and/or your care-partner.No polyps or cancer were seen.  You do have some old external hemorrhoids.  Next routine colonoscopy or other screening test in 10 years - 2034.  I appreciate the opportunity to care for you. Iva Boop, MD, [facility]

## 2022-11-17 NOTE — Progress Notes (Signed)
Report given to PACU, vss

## 2022-11-17 NOTE — Op Note (Signed)
[facility] Patient Name: Laura Carroll Procedure Date: 11/17/2022 10:37 AM MRN: [id number] Endoscopist: Iva Boop , MD, [id number] Age: 47 Referring MD:  Date of Birth: [birth year] Gender: Female Account #: [id number] Procedure:                Colonoscopy Indications:              Screening for colorectal malignant neoplasm, This                            is the patient's first colonoscopy Medicines:                Monitored Anesthesia Care Procedure:                Pre-Anesthesia Assessment:                           - Prior to the procedure, a History and Physical                            was performed, and patient medications and                            allergies were reviewed. The patient's tolerance of                            previous anesthesia was also reviewed. The risks                            and benefits of the procedure and the sedation                            options and risks were discussed with the patient.                            All questions were answered, and informed consent                            was obtained. Prior Anticoagulants: The patient has                            taken no anticoagulant or antiplatelet agents. ASA                            Grade Assessment: I - A normal, healthy patient.                            After reviewing the risks and benefits, the patient                            was deemed in satisfactory condition to undergo the                            procedure.  After obtaining informed consent, the colonoscope                            was passed under direct vision. Throughout the                            procedure, the patient's blood pressure, pulse, and                            oxygen saturations were monitored continuously. The                            CF [id number] #[id number] was introduced through the anus                            and advanced to the the  cecum, identified by                            appendiceal orifice and ileocecal valve. The                            colonoscopy was performed without difficulty. The                            patient tolerated the procedure well. The quality                            of the bowel preparation was excellent. The                            ileocecal valve, appendiceal orifice, and rectum                            were photographed. The bowel preparation used was                            SUPREP via split dose instruction. Scope In: 10:43:51 AM Scope Out: 10:53:58 AM Scope Withdrawal Time: 0 hours 6 minutes 15 seconds  Total Procedure Duration: 0 hours 10 minutes 7 seconds  Findings:                 Hemorrhoids were found on perianal exam.                           The exam was otherwise without abnormality on                            direct and retroflexion views. Complications:            No immediate complications. Estimated Blood Loss:     Estimated blood loss: none. Impression:               - Hemorrhoids found on perianal exam.                           -  The examination was otherwise normal on direct                            and retroflexion views.                           - No specimens collected. Recommendation:           - Patient has a contact number available for                            emergencies. The signs and symptoms of potential                            delayed complications were discussed with the                            patient. Return to normal activities tomorrow.                            Written discharge instructions were provided to the                            patient.                           - Resume previous diet.                           - Continue present medications.                           - Repeat colonoscopy in 10 years for screening                            purposes. Iva Boop, MD 11/17/2022 11:00:59 AM This report  has been signed electronically.

## 2022-11-18 ENCOUNTER — Telehealth: Payer: Self-pay | Admitting: *Deleted

## 2022-11-18 NOTE — Telephone Encounter (Signed)
Attempted f/u phone call. No answer. Left message.
